# Patient Record
Sex: Female | Born: 1985 | Race: White | Hispanic: No | Marital: Single | Smoking: Never smoker
Health system: Southern US, Community
[De-identification: ages and names within clinical notes are randomized; demographics above are authoritative.]

## PROBLEM LIST (undated history)

## (undated) DIAGNOSIS — T7840XA Allergy, unspecified, initial encounter: Secondary | ICD-10-CM

## (undated) HISTORY — DX: Allergy, unspecified, initial encounter: T78.40XA

## (undated) HISTORY — PX: NO PAST SURGERIES: SHX2092

---

## 2014-02-05 ENCOUNTER — Emergency Department: Payer: Self-pay | Admitting: Emergency Medicine

## 2014-06-24 IMAGING — CR DG FOOT COMPLETE 3+V*L*
1 series · 3 of 3 positions shown · non-contrast
Comparison: None.

CLINICAL DATA: Post trip and fall, now with pain and swelling
involving the third through fifth metatarsals

EXAM:
LEFT FOOT - COMPLETE 3+ VIEW

[Series 1: x foot ap left · 0.14mm/px · 3 of 3 slices shown]
[im 1/3]
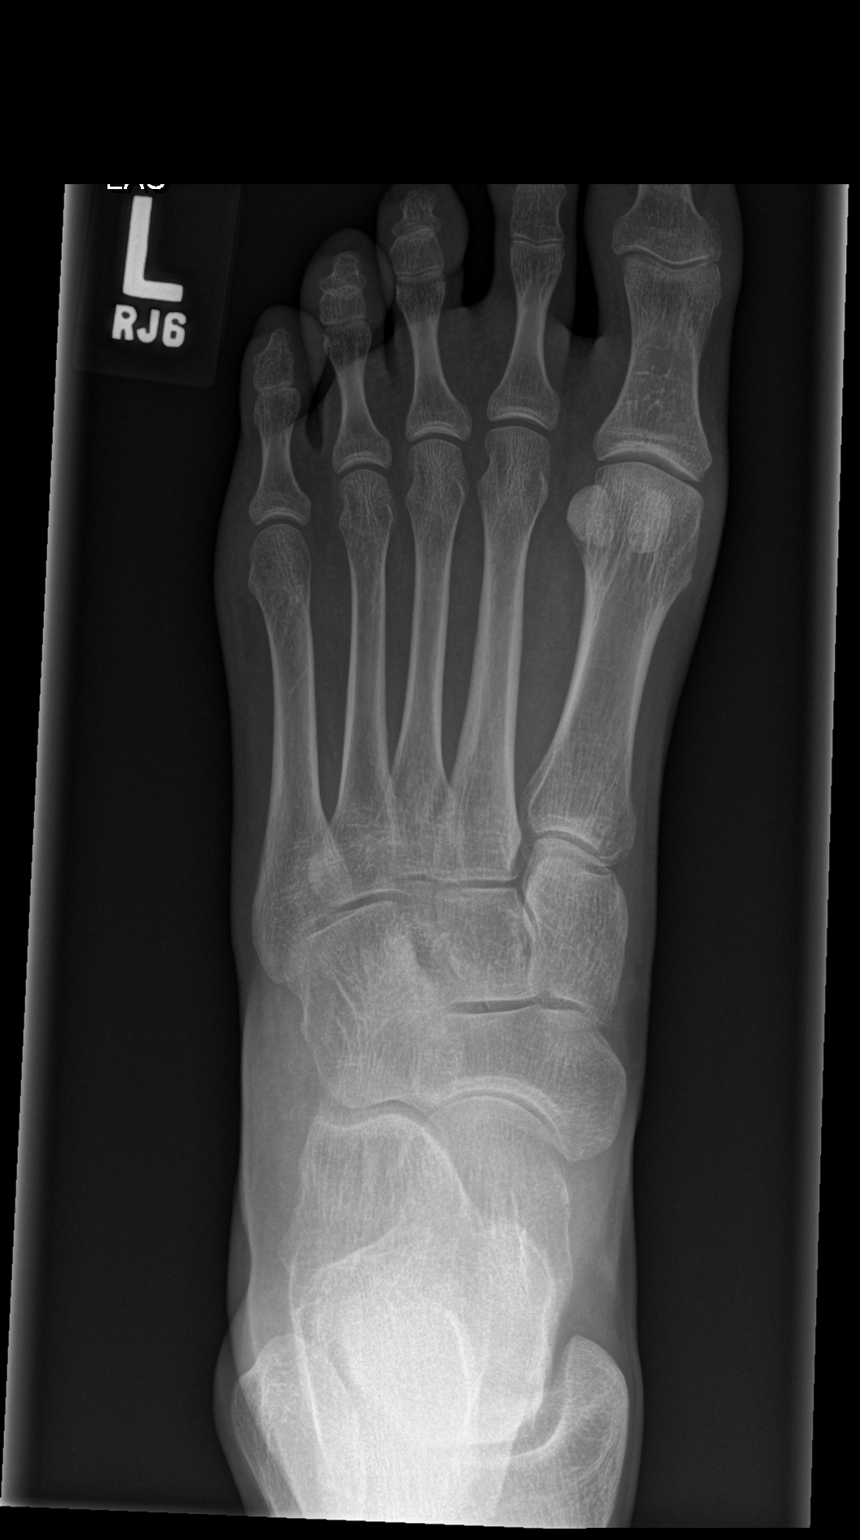
[im 2/3]
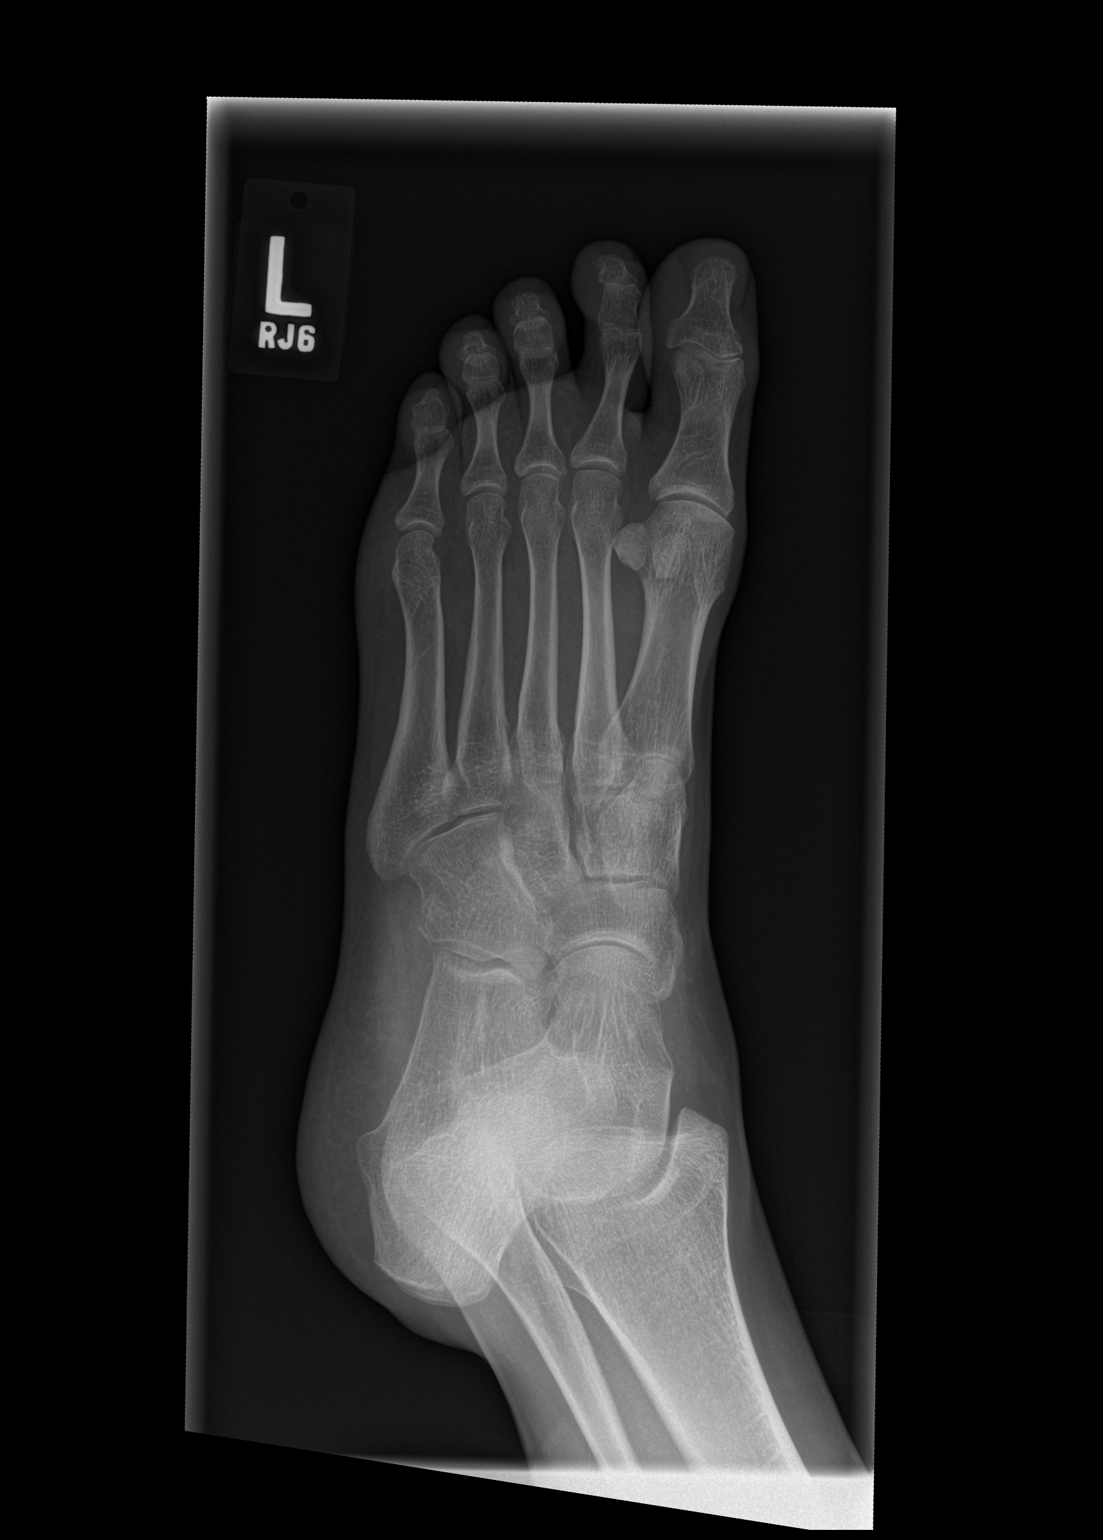
[im 3/3]
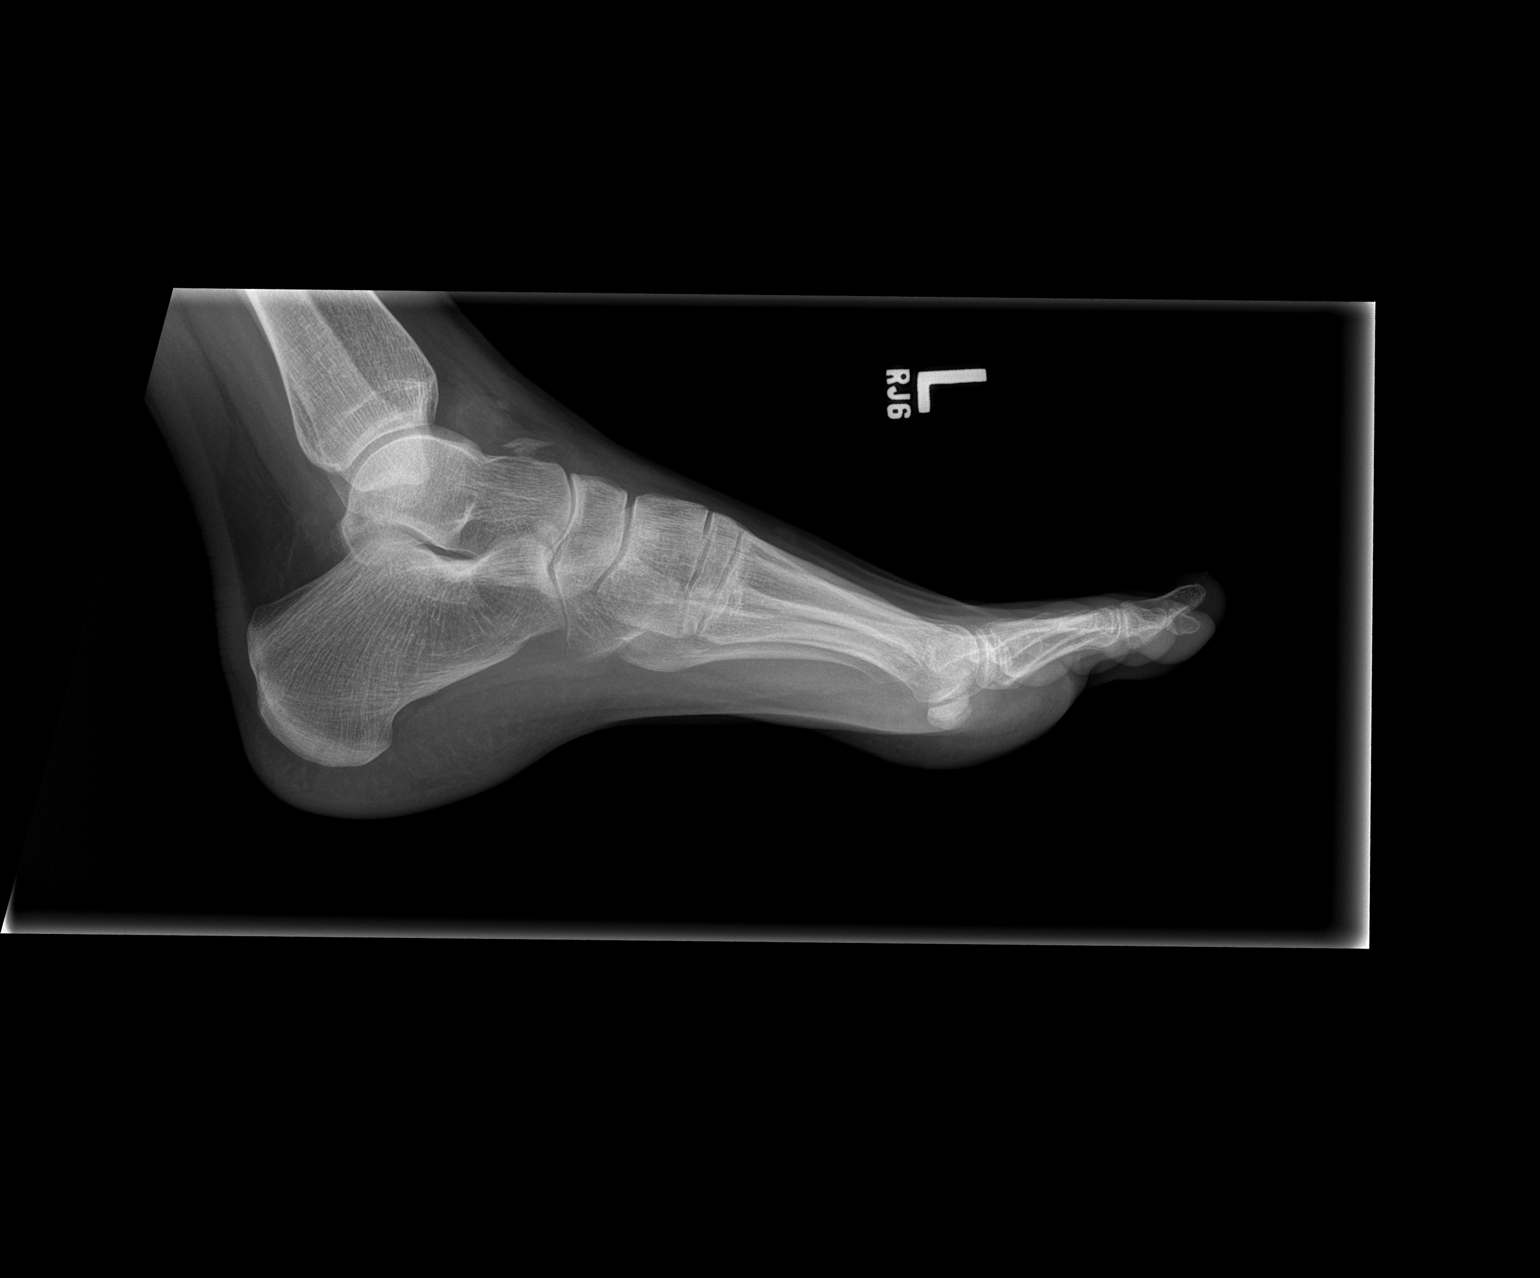

[3 of 3 positions shown; findings below may reference images not displayed]

FINDINGS: Seen only on the lateral radiograph is an apparent avulsion fracture
involving the presumed cranial aspect of the anterior beak of the
talus. This finding is associated with adjacent soft tissue
swelling.

Otherwise, no displaced fractures. Joint spaces are preserved. The
Lisfranc joint is preserved.

No radiopaque foreign body.
IMPRESSION: Seen only on the provided lateral radiograph is an apparent avulsion
fracture involving the cranial aspect of anterior beak of the talus.

## 2018-09-12 ENCOUNTER — Ambulatory Visit: Payer: Self-pay

## 2019-01-27 ENCOUNTER — Other Ambulatory Visit: Payer: Self-pay

## 2019-01-27 ENCOUNTER — Ambulatory Visit: Payer: BLUE CROSS/BLUE SHIELD | Admitting: Family Medicine

## 2019-01-27 ENCOUNTER — Encounter: Payer: Self-pay | Admitting: Family Medicine

## 2019-01-27 ENCOUNTER — Encounter (INDEPENDENT_AMBULATORY_CARE_PROVIDER_SITE_OTHER): Payer: Self-pay

## 2019-01-27 VITALS — BP 122/70 | HR 94 | Temp 98.1°F | Resp 16 | Ht 69.0 in | Wt 152.2 lb

## 2019-01-27 DIAGNOSIS — R6889 Other general symptoms and signs: Secondary | ICD-10-CM | POA: Diagnosis not present

## 2019-01-27 DIAGNOSIS — E049 Nontoxic goiter, unspecified: Secondary | ICD-10-CM

## 2019-01-27 DIAGNOSIS — Z23 Encounter for immunization: Secondary | ICD-10-CM

## 2019-01-27 DIAGNOSIS — R002 Palpitations: Secondary | ICD-10-CM | POA: Diagnosis not present

## 2019-01-27 DIAGNOSIS — E040 Nontoxic diffuse goiter: Secondary | ICD-10-CM | POA: Insufficient documentation

## 2019-01-27 DIAGNOSIS — R946 Abnormal results of thyroid function studies: Secondary | ICD-10-CM

## 2019-01-27 DIAGNOSIS — Z113 Encounter for screening for infections with a predominantly sexual mode of transmission: Secondary | ICD-10-CM

## 2019-01-27 DIAGNOSIS — Z13220 Encounter for screening for lipoid disorders: Secondary | ICD-10-CM

## 2019-01-27 NOTE — Progress Notes (Signed)
New Patient Office Visit  Subjective:  Patient ID: Gabrielle Ali, female    DOB: 11-30-1986  Age: 33 y.o. MRN: [id number]  CC:  Chief Complaint  Patient presents with  . Establish Care  . Thyroid Problem    per dentist need thyroid  nodule checked  . Headache    HPI Gabrielle Ali presents to establish care and for the following: - She has not been to see a PCP in at least 10 years.  Thyroid Nodule: Found by dentist about a month ago.  No difficulty swallowing, no family history of thyroid disease.  Does have some cold intolerance, endorses dry skin, diarrhea, and occasional palpitations (maybe 1-2 times a month and lasts a few seconds at most). No constipation. No PICA cravings.  Headaches/Migraines: Had history as a Ship broker.  In 2011 she had a her first migraine, a few years later she had another.  She has had 2 migraines in the last 2 years. When these do occur she is sensitive to light, has nausea, 1 episode of vomiting, and last about a day.  No aura is described. No vision changes, no numbness/tingling/weakness in extremities.  She does note she has an issue with syncope when her knees lock and she is very hot, but this has not happened in many years.   History reviewed. No pertinent past medical history.  Past Surgical History:  Procedure Laterality Date  . NO PAST SURGERIES      Family History  Problem Relation Age of Onset  . Glaucoma Maternal Grandmother   . Pneumonia Maternal Grandfather        Passed from pneumonia  . Alzheimer's disease Paternal Grandfather     Social History   Socioeconomic History  . Marital status: Single    Spouse name: Not on file  . Number of children: 0  . Years of education: Not on file  . Highest education level: Bachelor's degree (e.g., BA, AB, BS)  Occupational History    Employer: [employer]  Social Needs  . Financial resource strain: Not hard at all  . Food insecurity:    Worry: Never true   Inability: Never true  . Transportation needs:    Medical: No    Non-medical: No  Tobacco Use  . Smoking status: Never Smoker  . Smokeless tobacco: Never Used  Substance and Sexual Activity  . Alcohol use: Not Currently  . Drug use: Never  . Sexual activity: Not Currently    Partners: Male    Birth control/protection: None  Lifestyle  . Physical activity:    Days per week: 4 days    Minutes per session: 30 min  . Stress: Not at all  Relationships  . Social connections:    Talks on phone: More than three times a week    Gets together: More than three times a week    Attends religious service: Not on file    Active member of club or organization: No    Attends meetings of clubs or organizations: Never    Relationship status: Never married  . Intimate partner violence:    Fear of current or ex partner: No    Emotionally abused: No    Physically abused: No    Forced sexual activity: No  Other Topics Concern  . Not on file  Social History Narrative   Lives with her parents and her dog.    ROS Review of Systems  Constitutional: Negative.  Negative for chills, fatigue, fever  and unexpected weight change.  HENT: Negative.  Negative for congestion and sore throat.   Respiratory: Negative for cough and shortness of breath.   Cardiovascular: Negative for chest pain, palpitations and leg swelling.  Gastrointestinal: Negative for abdominal pain, constipation, diarrhea, nausea and vomiting.  Endocrine: Negative for polydipsia, polyphagia and polyuria.  Genitourinary: Negative.   Musculoskeletal: Negative.   Skin: Negative.   Neurological: Negative for light-headedness and headaches.  Hematological: Negative.   Psychiatric/Behavioral: Negative.   All other systems reviewed and are negative.  Objective:   Today's Vitals: BP 122/70 (BP Location: Right Arm, Patient Position: Sitting, Cuff Size: Large)   Pulse 94   Temp 98.1 F (36.7 C) (Oral)   Resp 16   Ht 5\' 9"  (1.753 m)    Wt 152 lb 3.2 oz (69 kg)   SpO2 98%   BMI 22.48 kg/m   Physical Exam Vitals signs and nursing note reviewed.  Constitutional:      General: She is not in acute distress.    Appearance: She is well-developed.  HENT:     Head: Normocephalic and atraumatic.     Right Ear: External ear normal.     Left Ear: External ear normal.     Nose: Nose normal.     Mouth/Throat:     Pharynx: No oropharyngeal exudate.  Eyes:     Conjunctiva/sclera: Conjunctivae normal.     Pupils: Pupils are equal, round, and reactive to light.  Neck:     Musculoskeletal: Normal range of motion and neck supple.     Thyroid: Thyromegaly (Diffuse large goiter that appears symmetric with no palpable nodule. ) present.     Vascular: No JVD.  Cardiovascular:     Rate and Rhythm: Normal rate and regular rhythm.     Heart sounds: Normal heart sounds.  Pulmonary:     Effort: Pulmonary effort is normal.     Breath sounds: Normal breath sounds.  Abdominal:     General: Bowel sounds are normal.     Palpations: Abdomen is soft.  Musculoskeletal: Normal range of motion.        General: No tenderness.  Skin:    General: Skin is warm and dry.     Findings: No rash.  Neurological:     Mental Status: She is alert and oriented to person, place, and time.     Cranial Nerves: No cranial nerve deficit.  Psychiatric:        Behavior: Behavior normal.        Thought Content: Thought content normal.        Judgment: Judgment normal.     Assessment & Plan:   Problem List Items Addressed This Visit      Endocrine   Goiter diffuse - Primary   Relevant Orders   US THYROID   Thyroid Panel With TSH     Other   Palpitations   Relevant Orders   US THYROID   Thyroid Panel With TSH   Lipid panel   COMPLETE METABOLIC PANEL WITH GFR   CBC w/Diff/Platelet   Cold intolerance   Relevant Orders   US THYROID   Thyroid Panel With TSH   COMPLETE METABOLIC PANEL WITH GFR   CBC w/Diff/Platelet    Other Visit Diagnoses     Routine screening for STI (sexually transmitted infection)       Relevant Orders   HIV Antibody (routine testing w rflx)   RPR   Hepatitis C antibody   Lipid screening  Relevant Orders   Lipid panel   Need for Tdap vaccination       Relevant Orders   Tdap vaccine greater than or equal to 7yo IM (Completed)      No outpatient encounter medications on file as of 01/27/2019.   No facility-administered encounter medications on file as of 01/27/2019.     Follow-up: Return in about 1 month (around 02/25/2019) for CPE w/ pap and w/ Follow Up - 40min appt.   Doren CustardEmily E Sathvik Tiedt, FNP

## 2019-01-27 NOTE — Patient Instructions (Addendum)
Headache Tips:  Keep a headache log: Record the date and time your headaches start and end, the severity of your pain (0-10), any possible triggers, what treatments you tried, and if any of those treatments worked.  Common Headache Triggers: Certain food additives, alcohol, artificial sweeteners (like aspartame), caffeine (overuse and when you stop suddenly), delayed or skipped meals, exercise, certain foods (chocolate, soft cheeses, red wine), bright lights, loud noises, menses, certain odors, oral contraceptives, depression and anxiety, sleep issues, smoke inhalation (smoking or second-hand smoke), stress, weather changes  Please work to reduce your stress by taking at least 30 minutes to exercise, stretch, and/or meditate every day.  Drink plenty of water throughout the day, do not skip meals, and eat healthy foods that are not fried, fatty, high in sugar, or processed.  

## 2019-01-28 LAB — LIPID PANEL
CHOL/HDL RATIO: 3 (calc) (ref <5.0)
CHOLESTEROL: 206 mg/dL — AB (ref <200)
HDL: 68 mg/dL (ref >=50)
LDL CHOLESTEROL (CALC): 119 mg/dL — AB
NON-HDL CHOLESTEROL (CALC): 138 mg/dL — AB (ref <130)
Triglycerides: 91 mg/dL (ref <150)

## 2019-01-28 LAB — THYROID PANEL WITH TSH
FREE THYROXINE INDEX: 2.3 (ref 1.4–3.8)
T3 UPTAKE: 29 % (ref 22–35)
T4 TOTAL: 7.9 ug/dL (ref 5.1–11.9)
TSH: 2.98 mIU/L

## 2019-01-28 LAB — HIV ANTIBODY (ROUTINE TESTING W REFLEX): HIV: NONREACTIVE

## 2019-01-28 LAB — CBC WITH DIFFERENTIAL/PLATELET
Absolute Monocytes: 663 cells/uL (ref 200–950)
BASOS ABS: 52 {cells}/uL (ref 0–200)
Basophils Relative: 0.8 %
EOS PCT: 1.2 %
Eosinophils Absolute: 78 cells/uL (ref 15–500)
HEMATOCRIT: 39.5 % (ref 35.0–45.0)
Hemoglobin: 12.9 g/dL (ref 11.7–15.5)
Lymphs Abs: 1762 cells/uL (ref 850–3900)
MCH: 28.9 pg (ref 27.0–33.0)
MCHC: 32.7 g/dL (ref 32.0–36.0)
MCV: 88.6 fL (ref 80.0–100.0)
MONOS PCT: 10.2 %
MPV: 10.3 fL (ref 7.5–12.5)
NEUTROS ABS: 3946 {cells}/uL (ref 1500–7800)
NEUTROS PCT: 60.7 %
PLATELETS: 400 10*3/uL (ref 140–400)
RBC: 4.46 10*6/uL (ref 3.80–5.10)
RDW: 11.9 % (ref 11.0–15.0)
Total Lymphocyte: 27.1 %
WBC: 6.5 10*3/uL (ref 3.8–10.8)

## 2019-01-28 LAB — HEPATITIS C ANTIBODY
Hepatitis C Ab: NONREACTIVE
SIGNAL TO CUT-OFF: 0.05 (ref <1.00)

## 2019-01-28 LAB — RPR: RPR: NONREACTIVE

## 2019-02-03 ENCOUNTER — Ambulatory Visit
Admission: RE | Admit: 2019-02-03 | Discharge: 2019-02-03 | Disposition: A | Discharge status: Home / Self Care | Payer: BLUE CROSS/BLUE SHIELD | Source: Ambulatory Visit | Attending: Family Medicine | Admitting: Family Medicine

## 2019-02-03 DIAGNOSIS — R002 Palpitations: Secondary | ICD-10-CM

## 2019-02-03 DIAGNOSIS — R6889 Other general symptoms and signs: Secondary | ICD-10-CM | POA: Diagnosis present

## 2019-02-03 DIAGNOSIS — E049 Nontoxic goiter, unspecified: Secondary | ICD-10-CM | POA: Insufficient documentation

## 2019-02-03 DIAGNOSIS — E040 Nontoxic diffuse goiter: Secondary | ICD-10-CM

## 2019-02-25 ENCOUNTER — Other Ambulatory Visit (HOSPITAL_COMMUNITY)
Admission: RE | Admit: 2019-02-25 | Discharge: 2019-02-25 | Disposition: A | Discharge status: Home / Self Care | Payer: BLUE CROSS/BLUE SHIELD | Source: Ambulatory Visit | Attending: Family Medicine | Admitting: Family Medicine

## 2019-02-25 ENCOUNTER — Encounter: Payer: Self-pay | Admitting: Family Medicine

## 2019-02-25 ENCOUNTER — Other Ambulatory Visit: Payer: Self-pay

## 2019-02-25 ENCOUNTER — Ambulatory Visit: Payer: BLUE CROSS/BLUE SHIELD | Admitting: Family Medicine

## 2019-02-25 VITALS — BP 120/80 | HR 101 | Temp 97.9°F | Resp 16 | Ht 68.5 in | Wt 151.9 lb

## 2019-02-25 DIAGNOSIS — E040 Nontoxic diffuse goiter: Secondary | ICD-10-CM

## 2019-02-25 DIAGNOSIS — Z124 Encounter for screening for malignant neoplasm of cervix: Secondary | ICD-10-CM | POA: Diagnosis not present

## 2019-02-25 DIAGNOSIS — Z01419 Encounter for gynecological examination (general) (routine) without abnormal findings: Secondary | ICD-10-CM | POA: Diagnosis present

## 2019-02-25 DIAGNOSIS — E7800 Pure hypercholesterolemia, unspecified: Secondary | ICD-10-CM

## 2019-02-25 DIAGNOSIS — E049 Nontoxic goiter, unspecified: Secondary | ICD-10-CM | POA: Diagnosis not present

## 2019-02-25 NOTE — Addendum Note (Signed)
Addended by: Doren Custard on: 02/25/2019 11:04 AM   Modules accepted: Orders

## 2019-02-25 NOTE — Progress Notes (Addendum)
Name: Gabrielle Ali   MRN: [id number]    DOB: 26-Dec-1985   Date:02/25/2019       Progress Note  Subjective  Chief Complaint  Chief Complaint  Patient presents with  . Annual Exam    HPI  Patient presents for annual CPE and follow up:  Goiter: Labs were WNL, US showed enlargement without nodule or focal abnormality, likely a sequelae from prior thyroiditis. Denies any recent palpitations, cold intolerance, difficulty swallowing or sob.  Will monitor Thyroid levels annually.  Elevated LDL: Discussed HLD diet, regular exercise, will monitor annually at this point.  Diet: heavy carb breakfast, skip lunch. Fruits/ vegetables/meats for dinner Exercise: walks dog 4-5x/week 1 mile   USPSTF grade A and B recommendations    Office Visit from 02/25/2019 in [facility]  AUDIT-C Score  0     Depression:  Depression screen [facility] 2/9 02/25/2019 01/27/2019  Decreased Interest 0 0  Down, Depressed, Hopeless 0 0  PHQ - 2 Score 0 0  Altered sleeping 0 0  Tired, decreased energy 0 0  Change in appetite 0 0  Feeling bad or failure about yourself  0 0  Trouble concentrating 0 0  Moving slowly or fidgety/restless 0 0  Suicidal thoughts 0 0  PHQ-9 Score 0 0  Difficult doing work/chores - Not difficult at all   Hypertension: BP Readings from Last 3 Encounters:  02/25/19 120/80  01/27/19 122/70   Obesity: Wt Readings from Last 3 Encounters:  02/25/19 151 lb 14.4 oz (68.9 kg)  01/27/19 152 lb 3.2 oz (69 kg)   BMI Readings from Last 3 Encounters:  02/25/19 22.76 kg/m  01/27/19 22.48 kg/m    Hep C Screening: Negative STD testing and prevention (HIV/chl/gon/syphilis): HIV and syphilis negative at last check, we will do gonorrhea/chlamydia and pap today. Intimate partner violence: No concerns Sexual History/Pain during Intercourse: No concerns Menstrual History/LMP/Abnormal Bleeding: No concers Incontinence Symptoms: No concerns  Advanced Care Planning: A  voluntary discussion about advance care planning including the explanation and discussion of advance directives.  Discussed health care proxy and Living will, and the patient was able to identify a health care proxy as Mother - Giavonni Cizek.  Patient does not have a living will at present time. If patient does have living will, I have requested they bring this to the clinic to be scanned in to their chart.  Breast cancer: No family history.  Breast exam today No results found for: HMMAMMO  BRCA gene screening: N/A Cervical cancer screening: Will check today  Osteoporosis Screening: Mom may have osteoporosis, PGM with osteoporosis.  No results found for: HMDEXASCAN  Lipids: Will check annually Lab Results  Component Value Date   CHOL 206 (H) 01/27/2019   Lab Results  Component Value Date   HDL 68 01/27/2019   Lab Results  Component Value Date   LDLCALC 119 (H) 01/27/2019   Lab Results  Component Value Date   TRIG 91 01/27/2019   Lab Results  Component Value Date   CHOLHDL 3.0 01/27/2019   No results found for: LDLDIRECT  Glucose: Normal.  Skin cancer: No concerning lesions; had skin survey from Dermatology. Colorectal cancer: Denies family or personal history of colorectal cancer, no changes in BM's - no blood in stool, dark and tarry stool, mucus in stool, or constipation/diarrhea. Lung cancer: Never smoker; Low Dose CT Chest recommended if Age 56-80 years, 30 pack-year currently smoking OR have quit w/in 15years. Patient does not qualify.  ECG:No chest pain, shortness of breath, palpitations  Patient Active Problem List   Diagnosis Date Noted  . 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 Palpitations 01/27/2019  . Cold intolerance 01/27/2019    Past Surgical History:  Procedure Laterality Date  . NO PAST SURGERIES      Family History  Problem Relation Age of Onset  . Glaucoma Maternal Grandmother   . Pneumonia Maternal Grandfather        Passed from pneumonia  .  Alzheimer's disease Paternal Grandfather   . Parkinson's disease Paternal Grandfather     Social History   Socioeconomic History  . Marital status: Single    Spouse name: Not on file  . Number of children: 0  . Years of education: Not on file  . Highest education level: Bachelor's degree (e.g., BA, AB, BS)  Occupational History    Employer: [employer]  Social Needs  . Financial resource strain: Not hard at all  . Food insecurity:    Worry: Never true    Inability: Never true  . Transportation needs:    Medical: No    Non-medical: No  Tobacco Use  . Smoking status: Never Smoker  . Smokeless tobacco: Never Used  Substance and Sexual Activity  . Alcohol use: Not Currently  . Drug use: Never  . Sexual activity: Not Currently    Partners: Male    Birth control/protection: None  Lifestyle  . Physical activity:    Days per week: 4 days    Minutes per session: 30 min  . Stress: Not at all  Relationships  . Social connections:    Talks on phone: More than three times a week    Gets together: More than three times a week    Attends religious service: Not on file    Active member of club or organization: No    Attends meetings of clubs or organizations: Never    Relationship status: Never married  . Intimate partner violence:    Fear of current or ex partner: No    Emotionally abused: No    Physically abused: No    Forced sexual activity: No  Other Topics Concern  . Not on file  Social History Narrative   Lives with her parents and her dog.    No current outpatient medications on file.  No Known Allergies   ROS  Constitutional: Negative for fever or weight change.  Respiratory: Negative for cough and shortness of breath.   Cardiovascular: Negative for chest pain or palpitations.  Gastrointestinal: Negative for abdominal pain, no bowel changes.  Musculoskeletal: Negative for gait problem or joint swelling.  Skin: Negative for rash.  Neurological: Negative  for dizziness or headache.  No other specific complaints in a complete review of systems (except as listed in HPI above).  Objective  Vitals:   02/25/19 1020  BP: 120/80  Pulse: (!) 101  Resp: 16  Temp: 97.9 F (36.6 C)  TempSrc: Oral  SpO2: 98%  Weight: 151 lb 14.4 oz (68.9 kg)  Height: 5' 8.5" (1.74 m)    Body mass index is 22.76 kg/m. Auscultated rate is approx 90bpm  Physical Exam  Constitutional: Patient appears well-developed and well-nourished. No distress.  HENT: Head: Normocephalic and atraumatic. Ears: B TMs ok, no erythema or effusion; Nose: Nose normal. Mouth/Throat: Oropharynx is clear and moist. No oropharyngeal exudate.  Eyes: Conjunctivae and EOM are normal. Pupils are equal, round, and reactive to light. No scleral icterus.  Neck: Normal range of motion.  Neck supple. No JVD present. Diffuse goiter present. Cardiovascular: Normal rate, regular rhythm and normal heart sounds.  No murmur heard. No BLE edema. Pulmonary/Chest: Effort normal and breath sounds normal. No respiratory distress. Abdominal: Soft. Bowel sounds are normal, no distension. There is no tenderness. no masses Breast: no lumps or masses, no nipple discharge or rashes FEMALE GENITALIA:  External genitalia normal External urethra normal Vaginal vault normal without discharge or lesions Cervix normal without discharge or lesions Bimanual exam normal without masses RECTAL: no rectal masses or hemorrhoids Musculoskeletal: Normal range of motion, no joint effusions. No gross deformities Neurological: he is alert and oriented to person, place, and time. No cranial nerve deficit. Coordination, balance, strength, speech and gait are normal.  Skin: Skin is warm and dry. No rash noted. No erythema.  Psychiatric: Patient has a normal mood and affect. behavior is normal. Judgment and thought content normal.  Recent Results (from the past 2160 hour(s))  Thyroid Panel With TSH     Status: None    Collection Time: 01/27/19 11:48 AM  Result Value Ref Range   T3 Uptake 29 22 - 35 %   T4, Total 7.9 5.1 - 11.9 mcg/dL   Free Thyroxine Index 2.3 1.4 - 3.8   TSH 2.98 mIU/L    Comment:           Reference Range .           > or = 20 Years  0.40-4.50 .                Pregnancy Ranges           First trimester    0.26-2.66           Second trimester   0.55-2.73           Third trimester    0.43-2.91   HIV Antibody (routine testing w rflx)     Status: None   Collection Time: 01/27/19 11:48 AM  Result Value Ref Range   HIV 1&2 Ab, 4th Generation NON-REACTIVE NON-REACTI    Comment: HIV-1 antigen and HIV-1/HIV-2 antibodies were not detected. There is no laboratory evidence of HIV infection. Marland Kitchen PLEASE NOTE: This information has been disclosed to you from records whose confidentiality may be protected by state law.  If your state requires such protection, then the state law prohibits you from making any further disclosure of the information without the specific written consent of the person to whom it pertains, or as otherwise permitted by law. A general authorization for the release of medical or other information is NOT sufficient for this purpose. . For additional information please refer to http://education.[facility].com/faq/FAQ106 (This link is being provided for informational/ educational purposes only.) . Marland Kitchen The performance of this assay has not been clinically validated in patients less than 62 years old. .   RPR     Status: None   Collection Time: 01/27/19 11:48 AM  Result Value Ref Range   RPR Ser Ql NON-REACTIVE NON-REACTI  Hepatitis C antibody     Status: None   Collection Time: 01/27/19 11:48 AM  Result Value Ref Range   Hepatitis C Ab NON-REACTIVE NON-REACTI   SIGNAL TO CUT-OFF 0.05 <1.00    Comment: . HCV antibody was non-reactive. There is no laboratory  evidence of HCV infection. . In most cases, no further action is required. However, if recent  HCV exposure is suspected, a test for HCV RNA (test code [phone]) is suggested. . For additional information please  refer to http://education.[facility].com/faq/FAQ22v1 (This link is being provided for informational/ educational purposes only.) .   Lipid panel     Status: Abnormal   Collection Time: 01/27/19 11:48 AM  Result Value Ref Range   Cholesterol 206 (H) <200 mg/dL   HDL 68 > OR = 50 mg/dL   Triglycerides 91 <150 mg/dL   LDL Cholesterol (Calc) 119 (H) mg/dL (calc)    Comment: Reference range: <100 . Desirable range <100 mg/dL for primary prevention;   <70 mg/dL for patients with CHD or diabetic patients  with > or = 2 CHD risk factors. Marland Kitchen LDL-C is now calculated using the Martin-Hopkins  calculation, which is a validated novel method providing  better accuracy than the Friedewald equation in the  estimation of LDL-C.  Cresenciano Genre et al. Annamaria Helling. 7989;211(94): 2061-2068  (http://education.[facility].com/faq/FAQ164)    Total CHOL/HDL Ratio 3.0 <5.0 (calc)   Non-HDL Cholesterol (Calc) 138 (H) <130 mg/dL (calc)    Comment: For patients with diabetes plus 1 major ASCVD risk  factor, treating to a non-HDL-C goal of <100 mg/dL  (LDL-C of <70 mg/dL) is considered a therapeutic  option.   CBC w/Diff/Platelet     Status: None   Collection Time: 01/27/19 11:48 AM  Result Value Ref Range   WBC 6.5 3.8 - 10.8 Thousand/uL   RBC 4.46 3.80 - 5.10 Million/uL   Hemoglobin 12.9 11.7 - 15.5 g/dL   HCT 39.5 35.0 - 45.0 %   MCV 88.6 80.0 - 100.0 fL   MCH 28.9 27.0 - 33.0 pg   MCHC 32.7 32.0 - 36.0 g/dL   RDW 11.9 11.0 - 15.0 %   Platelets 400 140 - 400 Thousand/uL   MPV 10.3 7.5 - 12.5 fL   Neutro Abs 3,946 1,500 - 7,800 cells/uL   Lymphs Abs 1,762 850 - 3,900 cells/uL   Absolute Monocytes 663 200 - 950 cells/uL   Eosinophils Absolute 78 15 - 500 cells/uL   Basophils Absolute 52 0 - 200 cells/uL   Neutrophils Relative % 60.7 %   Total Lymphocyte 27.1 %   Monocytes  Relative 10.2 %   Eosinophils Relative 1.2 %   Basophils Relative 0.8 %   PHQ2/9: Depression screen [facility] 2/9 02/25/2019 01/27/2019  Decreased Interest 0 0  Down, Depressed, Hopeless 0 0  PHQ - 2 Score 0 0  Altered sleeping 0 0  Tired, decreased energy 0 0  Change in appetite 0 0  Feeling bad or failure about yourself  0 0  Trouble concentrating 0 0  Moving slowly or fidgety/restless 0 0  Suicidal thoughts 0 0  PHQ-9 Score 0 0  Difficult doing work/chores - Not difficult at all   Fall Risk: Fall Risk  02/25/2019 01/27/2019  Falls in the past year? 0 0  Number falls in past yr: 0 0  Injury with Fall? 0 -  Follow up - Falls evaluation completed    Functional Status Survey: Is the patient deaf or have difficulty hearing?: No Does the patient have difficulty seeing, even when wearing glasses/contacts?: No Does the patient have difficulty concentrating, remembering, or making decisions?: No Does the patient have difficulty walking or climbing stairs?: No Does the patient have difficulty dressing or bathing?: No Does the patient have difficulty doing errands alone such as visiting a doctor's office or shopping?: No   Assessment & Plan  1. Well woman exam -USPSTF grade A and B recommendations reviewed with patient; age-appropriate recommendations, preventive care, screening tests, etc discussed and encouraged; healthy  living encouraged; see AVS for patient education given to patient -Discussed importance of 150 minutes of physical activity weekly, eat two servings of fish weekly, eat one serving of tree nuts ( cashews, pistachios, pecans, almonds.Marland Kitchen) every other day, eat 6 servings of fruit/vegetables daily and drink plenty of water and avoid sweet beverages.  - Pap per orders  2. Goiter diffuse - Stable, [place] normal, we will check labs annually.  3. Elevated LDL cholesterol level - Diet discussed, will check labs annually.

## 2019-02-27 LAB — CYTOLOGY - PAP
CHLAMYDIA, DNA PROBE: NEGATIVE
DIAGNOSIS: NEGATIVE
Neisseria Gonorrhea: NEGATIVE

## 2019-08-27 ENCOUNTER — Ambulatory Visit: Payer: Self-pay

## 2019-08-27 ENCOUNTER — Other Ambulatory Visit: Payer: Self-pay

## 2019-08-27 DIAGNOSIS — Z23 Encounter for immunization: Secondary | ICD-10-CM

## 2020-02-22 ENCOUNTER — Ambulatory Visit: Payer: Self-pay | Attending: Internal Medicine

## 2020-02-22 DIAGNOSIS — Z23 Encounter for immunization: Secondary | ICD-10-CM | POA: Insufficient documentation

## 2020-02-22 NOTE — Progress Notes (Signed)
   Covid-19 Vaccination Clinic  Name:  Gabrielle Ali    MRN: [id number] DOB: 07/24/1986  02/22/2020  Gabrielle Ali was observed post Covid-19 immunization for 15 minutes without incident. She was provided with Vaccine Information Sheet and instruction to access the V-Safe system.   Gabrielle Ali was instructed to call 911 with any severe reactions post vaccine: Marland Kitchen Difficulty breathing  . Swelling of face and throat  . A fast heartbeat  . A bad rash all over body  . Dizziness and weakness   Immunizations Administered    Name Date Dose VIS Date Route   Pfizer COVID-19 Vaccine 02/22/2020 11:35 AM 0.3 mL 11/28/2019 Intramuscular   Manufacturer: [employer], [employer]   Lot: [id number]   NDC: [address]-5

## 2020-02-26 ENCOUNTER — Encounter: Payer: BLUE CROSS/BLUE SHIELD | Admitting: Family Medicine

## 2020-03-01 ENCOUNTER — Encounter: Payer: BLUE CROSS/BLUE SHIELD | Admitting: Family Medicine

## 2020-03-17 ENCOUNTER — Ambulatory Visit: Payer: Self-pay | Attending: Internal Medicine

## 2020-03-17 DIAGNOSIS — Z23 Encounter for immunization: Secondary | ICD-10-CM

## 2020-03-17 NOTE — Progress Notes (Signed)
   Covid-19 Vaccination Clinic  Name:  Gabrielle Ali    MRN: [id number] DOB: 29-Apr-1986  03/17/2020  [name] was observed post Covid-19 immunization for 15 minutes without incident. She was provided with Vaccine Information Sheet and instruction to access the V-Safe system.   [name] was instructed to call 911 with any severe reactions post vaccine: Marland Kitchen Difficulty breathing  . Swelling of face and throat  . A fast heartbeat  . A bad rash all over body  . Dizziness and weakness   Immunizations Administered    Name Date Dose VIS Date Route   Pfizer COVID-19 Vaccine 03/17/2020  3:46 PM 0.3 mL 11/28/2019 Intramuscular   Manufacturer: [employer], [employer]   Lot: [id number]   NDC: [address]-8

## 2020-04-05 IMAGING — US US THYROID
1 series · 14 of 25 positions shown · non-contrast
Comparison: None.

CLINICAL DATA: Thyroid nodule on exam

EXAM:
THYROID ULTRASOUND
TECHNIQUE: Ultrasound examination of the thyroid gland and adjacent soft
tissues was performed.

[Series 1: us thyroid · 0.07mm/px · 14 of 43 slices shown]
[im 1/43]
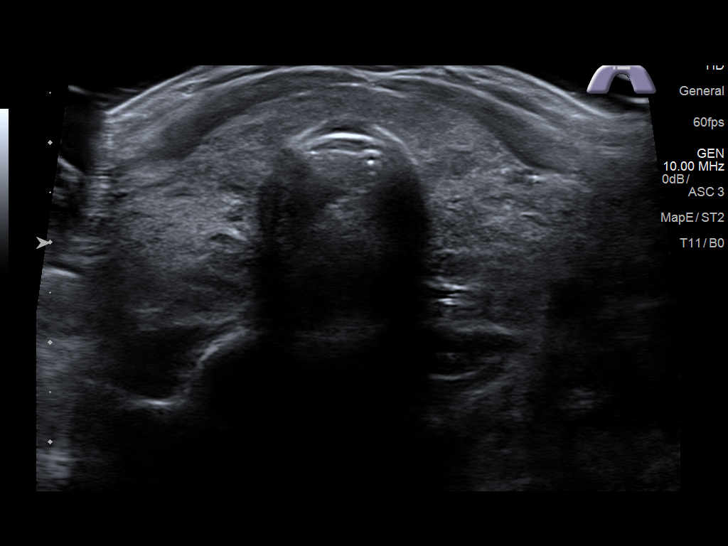
[im 4/43]
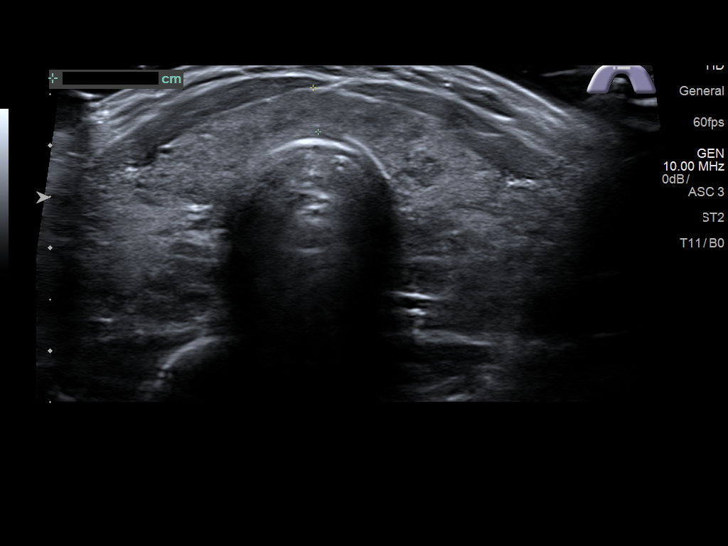
[im 8/43]
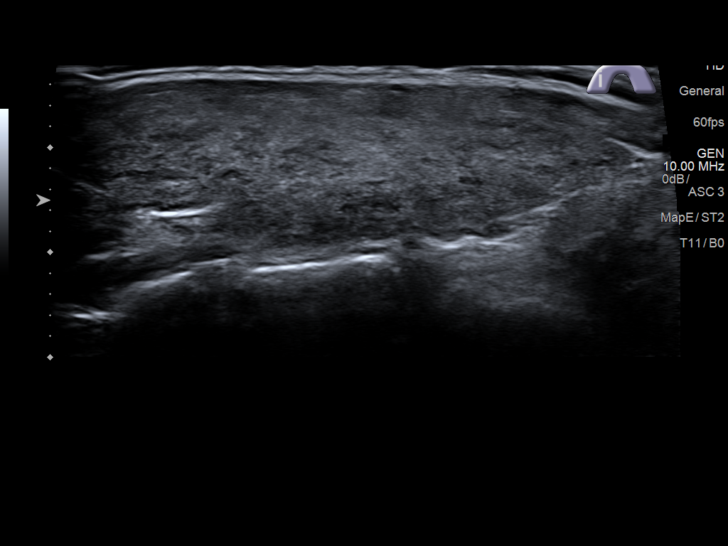
[im 11/43]
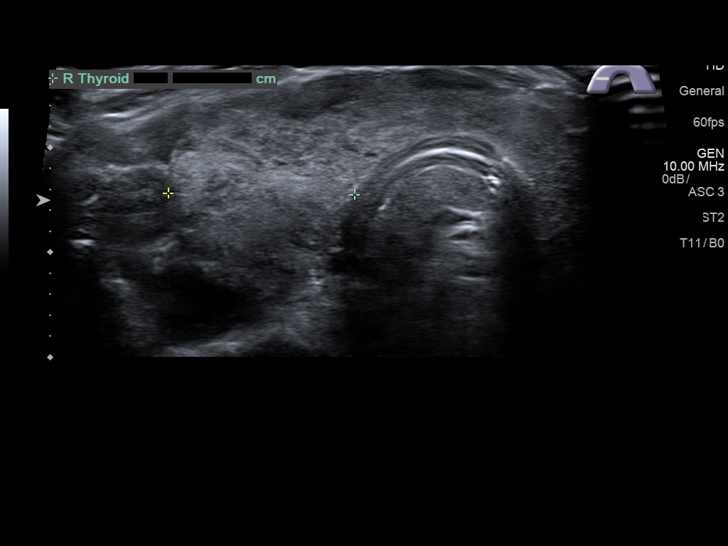
[im 15/43]
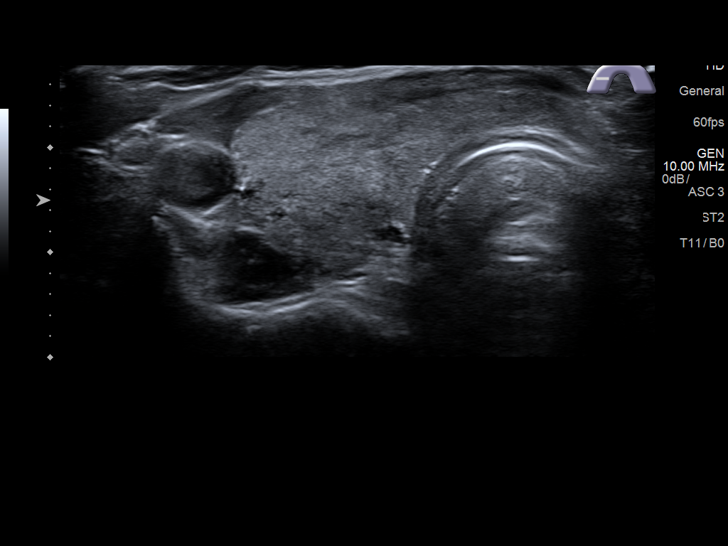
[im 16/43]
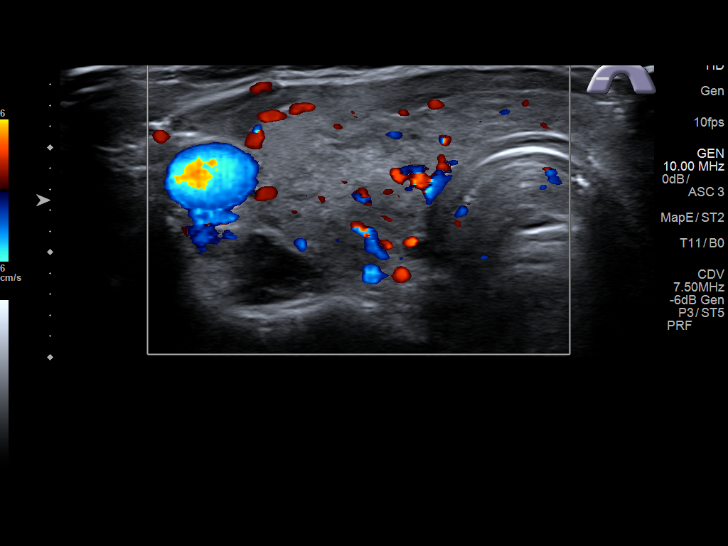
[im 20/43]
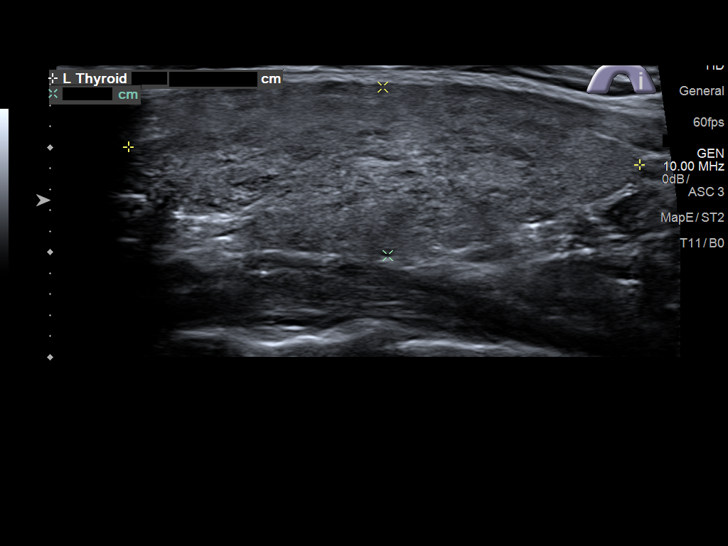
[im 23/43]
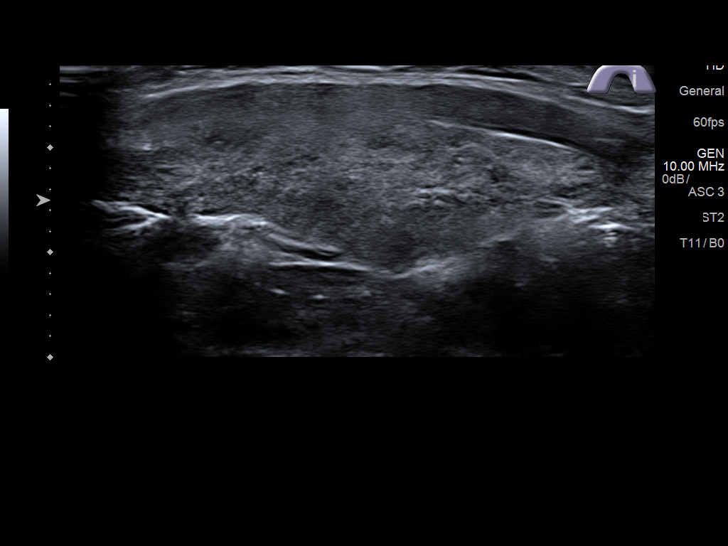
[im 27/43]
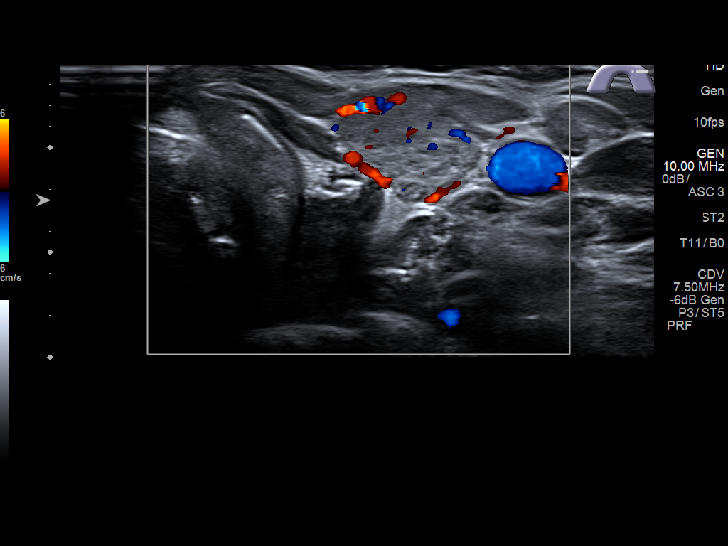
[im 29/43]
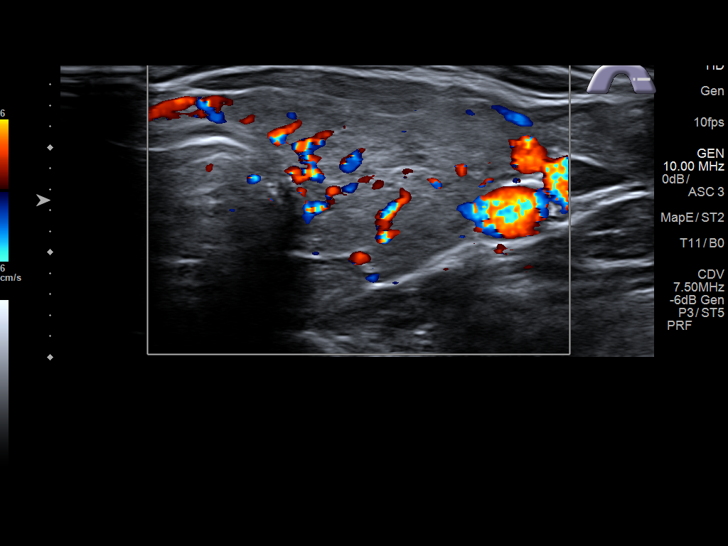
[im 32/43]
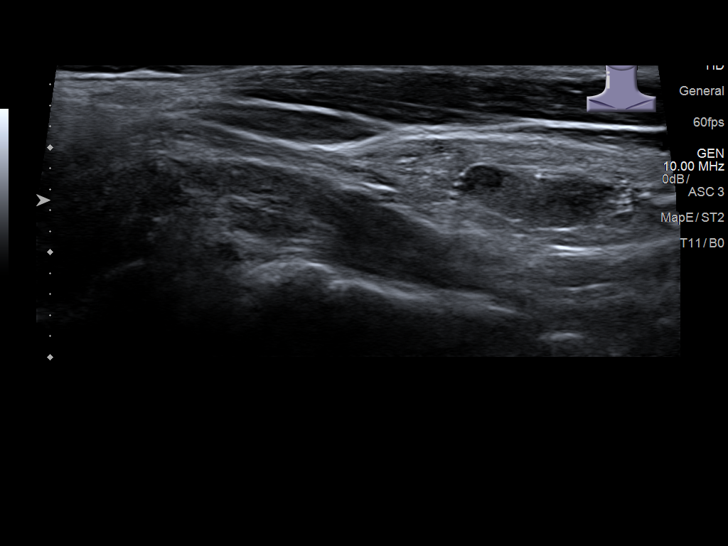
[im 36/43]
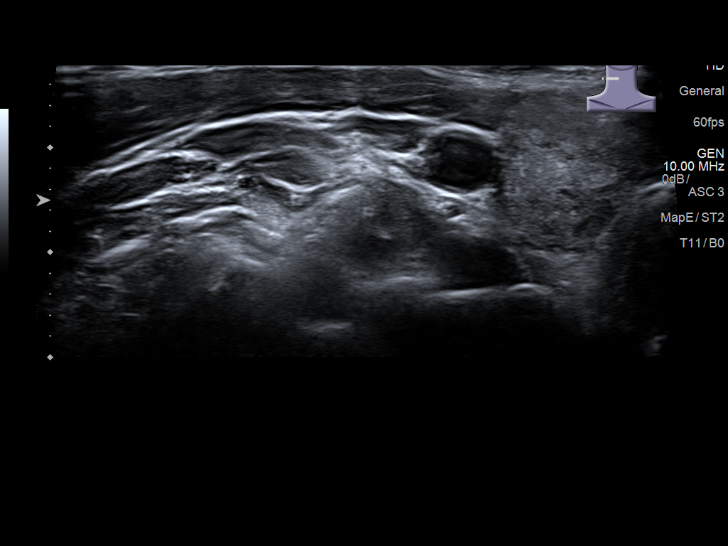
[im 39/43]
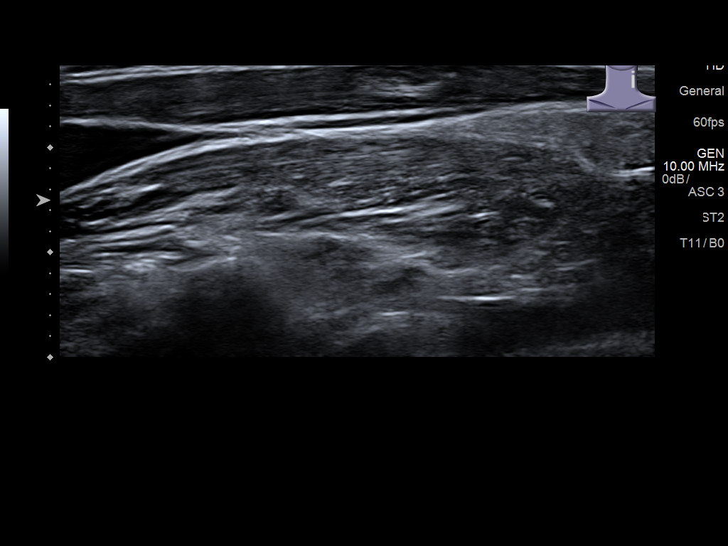
[im 43/43]
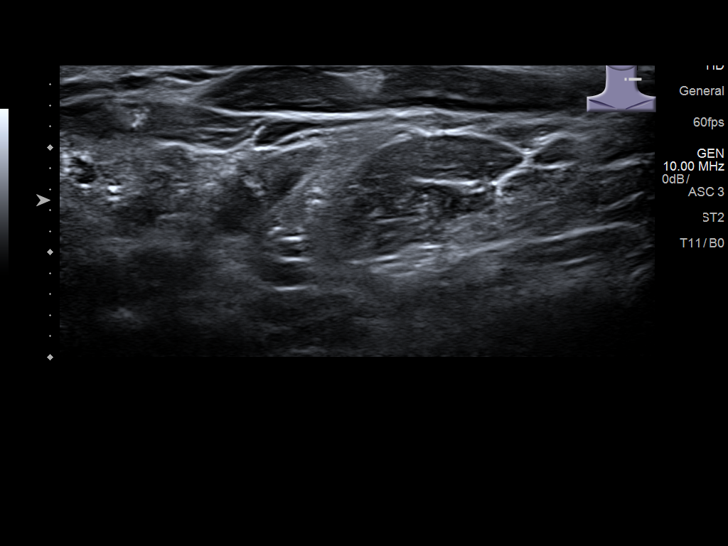

[14 of 25 positions shown; findings below may reference images not displayed]

FINDINGS: Parenchymal Echotexture: Moderately heterogenous

Isthmus: 4 mm

Right lobe: 4.9 x 1.4 x 1.8 cm

Left lobe: 4.9 x 1.6 x 2.3 cm

_________________________________________________________

Estimated total number of nodules >/= 1 cm: 0

Number of spongiform nodules >/=  2 cm not described below (TR1): 0

Number of mixed cystic and solid nodules >/= 1.5 cm not described
below (TR2): 0

_________________________________________________________

Moderate gland heterogeneity without discrete nodule or focal
abnormality. Normal vascularity. No regional adenopathy.
IMPRESSION: Nonspecific moderate gland heterogeneity may represent sequelae from
prior thyroiditis. No significant nodule or discrete abnormality.

No adenopathy

The above is in keeping with the ACR TI-RADS recommendations - [HOSPITAL] 9828;[DATE].

## 2020-05-18 ENCOUNTER — Encounter: Payer: BLUE CROSS/BLUE SHIELD | Admitting: Family Medicine

## 2020-06-01 ENCOUNTER — Encounter: Payer: Self-pay | Admitting: Family Medicine

## 2020-06-01 ENCOUNTER — Ambulatory Visit (INDEPENDENT_AMBULATORY_CARE_PROVIDER_SITE_OTHER): Payer: BC Managed Care – PPO | Admitting: Family Medicine

## 2020-06-01 ENCOUNTER — Other Ambulatory Visit: Payer: Self-pay

## 2020-06-01 VITALS — BP 112/72 | HR 92 | Temp 97.9°F | Resp 16 | Ht 69.0 in | Wt 158.1 lb

## 2020-06-01 DIAGNOSIS — E049 Nontoxic goiter, unspecified: Secondary | ICD-10-CM

## 2020-06-01 DIAGNOSIS — E040 Nontoxic diffuse goiter: Secondary | ICD-10-CM

## 2020-06-01 DIAGNOSIS — Z0000 Encounter for general adult medical examination without abnormal findings: Secondary | ICD-10-CM

## 2020-06-01 LAB — T4, FREE: Free T4: 1 ng/dL (ref 0.8–1.8)

## 2020-06-01 LAB — TSH: TSH: 2.78 mIU/L

## 2020-06-01 NOTE — Patient Instructions (Signed)
Preventive Care 21-35 Years Old, Female Preventive care refers to visits with your health care provider and lifestyle choices that can promote health and wellness. This includes:  A yearly physical exam. This may also be called an annual well check.  Regular dental visits and eye exams.  Immunizations.  Screening for certain conditions.  Healthy lifestyle choices, such as eating a healthy diet, getting regular exercise, not using drugs or products that contain nicotine and tobacco, and limiting alcohol use. What can I expect for my preventive care visit? Physical exam Your health care provider will check your:  Height and weight. This may be used to calculate body mass index (BMI), which tells if you are at a healthy weight.  Heart rate and blood pressure.  Skin for abnormal spots. Counseling Your health care provider may ask you questions about your:  Alcohol, tobacco, and drug use.  Emotional well-being.  Home and relationship well-being.  Sexual activity.  Eating habits.  Work and work environment.  Method of birth control.  Menstrual cycle.  Pregnancy history. What immunizations do I need?  Influenza (flu) vaccine  This is recommended every year. Tetanus, diphtheria, and pertussis (Tdap) vaccine  You may need a Td booster every 10 years. Varicella (chickenpox) vaccine  You may need this if you have not been vaccinated. Human papillomavirus (HPV) vaccine  If recommended by your health care provider, you may need three doses over 6 months. Measles, mumps, and rubella (MMR) vaccine  You may need at least one dose of MMR. You may also need a second dose. Meningococcal conjugate (MenACWY) vaccine  One dose is recommended if you are age 19-21 years and a first-year college student living in a residence hall, or if you have one of several medical conditions. You may also need additional booster doses. Pneumococcal conjugate (PCV13) vaccine  You may need  this if you have certain conditions and were not previously vaccinated. Pneumococcal polysaccharide (PPSV23) vaccine  You may need one or two doses if you smoke cigarettes or if you have certain conditions. Hepatitis A vaccine  You may need this if you have certain conditions or if you travel or work in places where you may be exposed to hepatitis A. Hepatitis B vaccine  You may need this if you have certain conditions or if you travel or work in places where you may be exposed to hepatitis B. Haemophilus influenzae type b (Hib) vaccine  You may need this if you have certain conditions. You may receive vaccines as individual doses or as more than one vaccine together in one shot (combination vaccines). Talk with your health care provider about the risks and benefits of combination vaccines. What tests do I need?  Blood tests  Lipid and cholesterol levels. These may be checked every 5 years starting at age 20.  Hepatitis C test.  Hepatitis B test. Screening  Diabetes screening. This is done by checking your blood sugar (glucose) after you have not eaten for a while (fasting).  Sexually transmitted disease (STD) testing.  BRCA-related cancer screening. This may be done if you have a family history of breast, ovarian, tubal, or peritoneal cancers.  Pelvic exam and Pap test. This may be done every 3 years starting at age 21. Starting at age 30, this may be done every 5 years if you have a Pap test in combination with an HPV test. Talk with your health care provider about your test results, treatment options, and if necessary, the need for more tests.   Follow these instructions at home: Eating and drinking   Eat a diet that includes fresh fruits and vegetables, whole grains, lean protein, and low-fat dairy.  Take vitamin and mineral supplements as recommended by your health care provider.  Do not drink alcohol if: ? Your health care provider tells you not to drink. ? You are  pregnant, may be pregnant, or are planning to become pregnant.  If you drink alcohol: ? Limit how much you have to 0-1 drink a day. ? Be aware of how much alcohol is in your drink. In the [place] one drink equals one 12 oz bottle of beer (355 mL), one 5 oz glass of wine (148 mL), or one 1 oz glass of hard liquor (44 mL). Lifestyle  Take daily care of your teeth and gums.  Stay active. Exercise for at least 30 minutes on 5 or more days each week.  Do not use any products that contain nicotine or tobacco, such as cigarettes, e-cigarettes, and chewing tobacco. If you need help quitting, ask your health care provider.  If you are sexually active, practice safe sex. Use a condom or other form of birth control (contraception) in order to prevent pregnancy and STIs (sexually transmitted infections). If you plan to become pregnant, see your health care provider for a preconception visit. What's next?  Visit your health care provider once a year for a well check visit.  Ask your health care provider how often you should have your eyes and teeth checked.  Stay up to date on all vaccines. This information is not intended to replace advice given to you by your health care provider. Make sure you discuss any questions you have with your health care provider. Document Revised: 08/15/2018 Document Reviewed: 08/15/2018 Elsevier Patient Education  2020 [employer].

## 2020-06-01 NOTE — Progress Notes (Signed)
Patient: Gabrielle Ali, Female    DOB: [birth year], 34 y.o.   MRN: [id number] Gabrielle Grana, PA-C Visit Date: 06/01/2020  Today's Provider: Delsa Grana, PA-C   Chief Complaint  Patient presents with  . Annual Exam    no pap   Subjective:   Annual physical exam:  Gabrielle Ali is a 34 y.o. female who presents today for complete physical exam:  Exercise/Activity:  Working out with friends through zoom Diet/nutrition:   Too many carbs, potato chips, adding more fruits and veggies Sleep:  Sleeping great 7-8 hours  Thyroid is same size maybe decreasing - prior US and labs, no sx currently Current Symptoms: she denies fatigue, weight gain, feeling cold and cold intolerance, constipation, swelling, feeling slow, losing hair, anxiousness, feeling excessive energy, tremulousness, palpitations, sweating and weight loss   USPSTF grade A and B recommendations - reviewed and addressed today  Depression:  Phq 9 completed today by patient, was reviewed by me with patient in the room PHQ score is neg, pt feels  PHQ 2/9 Scores 06/01/2020 02/25/2019 01/27/2019  PHQ - 2 Score 0 0 0  PHQ- 9 Score 0 0 0   Depression screen [facility] 2/9 06/01/2020 02/25/2019 01/27/2019  Decreased Interest 0 0 0  Down, Depressed, Hopeless 0 0 0  PHQ - 2 Score 0 0 0  Altered sleeping 0 0 0  Tired, decreased energy 0 0 0  Change in appetite 0 0 0  Feeling bad or failure about yourself  0 0 0  Trouble concentrating 0 0 0  Moving slowly or fidgety/restless 0 0 0  Suicidal thoughts 0 0 0  PHQ-9 Score 0 0 0  Difficult doing work/chores Not difficult at all - Not difficult at all    Alcohol screening:   Office Visit from 06/01/2020 in [facility]  AUDIT-C Score 0      Immunizations and Health Maintenance: Health Maintenance  Topic Date Due  . INFLUENZA VACCINE  07/18/2020  . PAP SMEAR-Modifier  02/24/2022  . TETANUS/TDAP  01/27/2029  . COVID-19 Vaccine  Completed  . Hepatitis C  Screening  Completed  . HIV Screening  Completed     Hep C Screening: done previously  STD testing and prevention (HIV/chl/gon/syphilis):  see above, no additional testing desired by pt today Done previously and neg  Intimate partner violence:    Sexual History/Pain during Intercourse: Single  Menstrual History/LMP/Abnormal Bleeding:   Normal, regular cycles, last about 4 d with spotting on 5th day Patient's last menstrual period was 05/11/2020.  Incontinence Symptoms:   none  Breast cancer: not indicated yet for age Last Mammogram: *See HM list above BRCA gene screening: none  Cervical cancer screening:   Pt denies family hx of cancers - breast, ovarian, uterine, colon:     Osteoporosis:    Skin cancer:  Sees dermatology, goes skin survery  Hx of skin CA -  NO Discussed atypical lesions   Colorectal cancer:   Colonoscopy is not due per age Discussed concerning signs and sx of CRC, pt denies   Lung cancer:    n/a Low Dose CT Chest recommended if Age 48-80 years, 30 pack-year currently smoking OR have quit w/in 15years. Patient does not qualify.    Social History   Tobacco Use  . Smoking status: Never Smoker  . Smokeless tobacco: Never Used  Vaping Use  . Vaping Use: Never used  Substance Use Topics  . Alcohol use: Not Currently  . Drug  use: Never       [facility] Visit from 06/01/2020 in [facility]  AUDIT-C Score 0      Family History  Problem Relation Age of Onset  . Glaucoma Maternal Grandmother   . Pneumonia Maternal Grandfather        Passed from pneumonia  . Parkinson's disease Paternal Grandfather      Blood pressure/Hypertension: BP Readings from Last 3 Encounters:  06/01/20 112/72  02/25/19 120/80  01/27/19 122/70    Weight/Obesity: Wt Readings from Last 3 Encounters:  06/01/20 158 lb 1.6 oz (71.7 kg)  02/25/19 151 lb 14.4 oz (68.9 kg)  01/27/19 152 lb 3.2 oz (69 kg)   BMI Readings from Last 3 Encounters:  06/01/20  23.35 kg/m  02/25/19 22.76 kg/m  01/27/19 22.48 kg/m     Lipids:  Lab Results  Component Value Date   CHOL 206 (H) 01/27/2019   Lab Results  Component Value Date   HDL 68 01/27/2019   Lab Results  Component Value Date   LDLCALC 119 (H) 01/27/2019   Lab Results  Component Value Date   TRIG 91 01/27/2019   Lab Results  Component Value Date   CHOLHDL 3.0 01/27/2019   No results found for: LDLDIRECT Based on the results of lipid panel his/her cardiovascular risk factor ( using [place] )  in the next 10 years is: The ASCVD Risk score Mikey Bussing DC Jr., et al., 2013) failed to calculate for the following reasons:   The 2013 ASCVD risk score is only valid for ages 30 to 18 Glucose:  No results found for: GLUCOSE, GLUCAP Hypertension: BP Readings from Last 3 Encounters:  06/01/20 112/72  02/25/19 120/80  01/27/19 122/70   Obesity: Wt Readings from Last 3 Encounters:  06/01/20 158 lb 1.6 oz (71.7 kg)  02/25/19 151 lb 14.4 oz (68.9 kg)  01/27/19 152 lb 3.2 oz (69 kg)   BMI Readings from Last 3 Encounters:  06/01/20 23.35 kg/m  02/25/19 22.76 kg/m  01/27/19 22.48 kg/m     Advanced Care Planning:  A voluntary discussion about advance care planning including the explanation and discussion of advance directives.   Discussed health care proxy and Living will, and the patient was able to identify a health care proxy as mom and dad, paul Reffitt and [place] Patient does not have a living will at present time.   Social History      She        Social History   Socioeconomic History  . Marital status: Single    Spouse name: Not on file  . Number of children: 0  . Years of education: Not on file  . Highest education level: Bachelor's degree (e.g., BA, AB, BS)  Occupational History    Employer: [employer]  Tobacco Use  . Smoking status: Never Smoker  . Smokeless tobacco: Never Used  Vaping Use  . Vaping Use: Never used  Substance and Sexual  Activity  . Alcohol use: Not Currently  . Drug use: Never  . Sexual activity: Not Currently    Partners: Male    Birth control/protection: None  Other Topics Concern  . Not on file  Social History Narrative   Lives with her parents and her dog.   Social Determinants of Health   Financial Resource Strain:   . Difficulty of Paying Living Expenses:   Food Insecurity:   . Worried About Charity fundraiser in the Last Year:   .  Ran Out of Food in the Last Year:   Transportation Needs:   . Film/video editor (Medical):   Marland Kitchen Lack of Transportation (Non-Medical):   Physical Activity:   . Days of Exercise per Week:   . Minutes of Exercise per Session:   Stress:   . Feeling of Stress :   Social Connections:   . Frequency of Communication with Friends and Family:   . Frequency of Social Gatherings with Friends and Family:   . Attends Religious Services:   . Active Member of Clubs or Organizations:   . Attends Archivist Meetings:   Marland Kitchen Marital Status:     Family History        Family History  Problem Relation Age of Onset  . Glaucoma Maternal Grandmother   . Pneumonia Maternal Grandfather        Passed from pneumonia  . Parkinson's disease Paternal Grandfather     Patient Active Problem List   Diagnosis Date Noted  . 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 Palpitations 01/27/2019  . Cold intolerance 01/27/2019    Past Surgical History:  Procedure Laterality Date  . NO PAST SURGERIES       Current Outpatient Medications:  .  cetirizine (ZYRTEC) 10 MG tablet, Take 10 mg by mouth daily., Disp: , Rfl:   No Known Allergies  Patient Care Team: Gabrielle Grana, PA-C as PCP - General (Family Medicine)  Review of Systems  Constitutional: Negative.  Negative for activity change, appetite change, fatigue and unexpected weight change.  HENT: Negative.   Eyes: Negative.   Respiratory: Negative.  Negative for shortness of breath.   Cardiovascular: Negative.  Negative for  chest pain, palpitations and leg swelling.  Gastrointestinal: Negative.  Negative for abdominal pain and blood in stool.  Endocrine: Negative.   Genitourinary: Negative.   Musculoskeletal: Negative.  Negative for arthralgias, gait problem, joint swelling and myalgias.  Skin: Negative.  Negative for color change, pallor and rash.  Allergic/Immunologic: Negative.   Neurological: Negative.  Negative for syncope and weakness.  Hematological: Negative.   Psychiatric/Behavioral: Negative.  Negative for confusion, dysphoric mood, self-injury and suicidal ideas. The patient is not nervous/anxious.      I personally reviewed active problem list, medication list, allergies, family history, social history, health maintenance, notes from last encounter, lab results, imaging with the patient/caregiver today.        Objective:   Vitals:  Vitals:   06/01/20 1311  BP: 112/72  Pulse: 92  Resp: 16  Temp: 97.9 F (36.6 C)  TempSrc: Temporal  SpO2: 98%  Weight: 158 lb 1.6 oz (71.7 kg)  Height: '5\' 9"'  (1.753 m)    Body mass index is 23.35 kg/m.  Physical Exam Vitals and nursing note reviewed.  Constitutional:      General: She is not in acute distress.    Appearance: Normal appearance. She is well-developed. She is not ill-appearing, toxic-appearing or diaphoretic.  HENT:     Head: Normocephalic and atraumatic.     Right Ear: External ear normal.     Left Ear: External ear normal.     Nose: Nose normal.     Mouth/Throat:     Pharynx: Uvula midline.  Eyes:     General: Lids are normal.     Conjunctiva/sclera: Conjunctivae normal.     Pupils: Pupils are equal, round, and reactive to light.  Neck:     Thyroid: Thyromegaly present. No thyroid tenderness.     Trachea: Trachea and phonation normal.  No tracheal deviation.  Cardiovascular:     Rate and Rhythm: Normal rate and regular rhythm.     Pulses: Normal pulses.          Radial pulses are 2+ on the right side and 2+ on the left side.        Posterior tibial pulses are 2+ on the right side and 2+ on the left side.     Heart sounds: Normal heart sounds. No murmur heard.  No friction rub. No gallop.   Pulmonary:     Effort: Pulmonary effort is normal. No respiratory distress.     Breath sounds: Normal breath sounds. No stridor. No wheezing, rhonchi or rales.  Chest:     Chest wall: No tenderness.  Abdominal:     General: Bowel sounds are normal. There is no distension.     Palpations: Abdomen is soft.     Tenderness: There is no abdominal tenderness. There is no guarding or rebound.  Musculoskeletal:        General: No deformity. Normal range of motion.     Cervical back: Normal range of motion and neck supple.  Lymphadenopathy:     Cervical: No cervical adenopathy.  Skin:    General: Skin is warm and dry.     Capillary Refill: Capillary refill takes less than 2 seconds.     Coloration: Skin is not pale.     Findings: No rash.  Neurological:     Mental Status: She is alert and oriented to person, place, and time.     Motor: No abnormal muscle tone.     Gait: Gait normal.  Psychiatric:        Speech: Speech normal.        Behavior: Behavior normal.       Fall Risk: Fall Risk  06/01/2020 02/25/2019 01/27/2019  Falls in the past year? 0 0 0  Number falls in past yr: 0 0 0  Injury with Fall? 0 0 -  Follow up Falls evaluation completed - Falls evaluation completed    Functional Status Survey: Is the patient deaf or have difficulty hearing?: No Does the patient have difficulty seeing, even when wearing glasses/contacts?: No Does the patient have difficulty concentrating, remembering, or making decisions?: No Does the patient have difficulty walking or climbing stairs?: No Does the patient have difficulty dressing or bathing?: No Does the patient have difficulty doing errands alone such as visiting a doctor's office or shopping?: No   Assessment & Plan:    CPE completed today  . USPSTF grade A and B  recommendations reviewed with patient; age-appropriate recommendations, preventive care, screening tests, etc discussed and encouraged; healthy living encouraged; see AVS for patient education given to patient  . Discussed importance of 150 minutes of physical activity weekly, AHA exercise recommendations given to pt in AVS/handout  . Discussed importance of healthy diet:  eating lean meats and proteins, avoiding trans fats and saturated fats, avoid simple sugars and excessive carbs in diet, eat 6 servings of fruit/vegetables daily and drink plenty of water and avoid sweet beverages.    . Recommended pt to do annual eye exam and routine dental exams/cleanings  . Depression, alcohol, fall screening completed as documented above and per flowsheets  . Reviewed Health Maintenance: Health Maintenance  Topic Date Due  . INFLUENZA VACCINE  07/18/2020  . PAP SMEAR-Modifier  02/24/2022  . TETANUS/TDAP  01/27/2029  . COVID-19 Vaccine  Completed  . Hepatitis C Screening  Completed  .  HIV Screening  Completed    . Immunizations: Immunization History  Administered Date(s) Administered  . Influenza,inj,Quad PF,6+ Mos 08/27/2019  . Influenza-Unspecified 08/28/2017, 09/12/2018  . MMR 10/12/2018  . Meningococcal Conjugate 07/08/2004  . PFIZER SARS-COV-2 Vaccination 02/22/2020, 03/17/2020  . Tdap 05/16/2002, 01/27/2019      1. Adult general medical exam Completed as per above  2. Goiter diffuse Pt feels it is slightly decreased in size, no current or active sx of hyper or hypothyroid, recheck labs - will likely continue with annual monitoring - TSH - T4, free   Gabrielle Grana, PA-C 06/01/20 1:32 PM  [facility]

## 2021-06-03 ENCOUNTER — Other Ambulatory Visit: Payer: Self-pay

## 2021-06-03 ENCOUNTER — Encounter: Payer: Self-pay | Admitting: Family Medicine

## 2021-06-03 ENCOUNTER — Ambulatory Visit (INDEPENDENT_AMBULATORY_CARE_PROVIDER_SITE_OTHER): Payer: BC Managed Care – PPO | Admitting: Family Medicine

## 2021-06-03 VITALS — BP 112/64 | HR 97 | Temp 98.0°F | Resp 16 | Ht 69.0 in | Wt 156.6 lb

## 2021-06-03 DIAGNOSIS — E040 Nontoxic diffuse goiter: Secondary | ICD-10-CM

## 2021-06-03 DIAGNOSIS — E785 Hyperlipidemia, unspecified: Secondary | ICD-10-CM | POA: Diagnosis not present

## 2021-06-03 DIAGNOSIS — E049 Nontoxic goiter, unspecified: Secondary | ICD-10-CM | POA: Diagnosis not present

## 2021-06-03 DIAGNOSIS — Z0000 Encounter for general adult medical examination without abnormal findings: Secondary | ICD-10-CM | POA: Diagnosis not present

## 2021-06-03 DIAGNOSIS — J309 Allergic rhinitis, unspecified: Secondary | ICD-10-CM | POA: Insufficient documentation

## 2021-06-03 DIAGNOSIS — H6981 Other specified disorders of Eustachian tube, right ear: Secondary | ICD-10-CM

## 2021-06-03 MED ORDER — FLUTICASONE PROPIONATE 50 MCG/ACT NA SUSP: 2.0000 | Freq: Every day | NASAL | 6 refills | Status: DC

## 2021-06-03 NOTE — Progress Notes (Signed)
Patient: Gabrielle Ali, Female    DOB: 12-20-1985, 35 y.o.   MRN: [id number] Delsa Grana, PA-C Visit Date: 06/03/2021  Today's Provider: Delsa Grana, PA-C   Chief Complaint  Patient presents with   Annual Exam   Subjective:   Annual physical exam:  Gabrielle Ali is a 35 y.o. female who presents today for complete physical exam:  Exercise/Activity: 3 d week 40 min each Diet/nutrition: healthy, wants to improve health Sleep: no concerns  Pt wished to do routine f/up on chronic conditions today in addition to CPE. Advised pt of separate visit billing/coding  Goiter hx of palpitations/cold intolerance, last thyroid labs were normal Lab Results  Component Value Date   TSH 2.78 06/01/2020   Hyperlipidemia: Not on meds Last Lipids: Lab Results  Component Value Date   CHOL 206 (H) 01/27/2019   HDL 68 01/27/2019   LDLCALC 119 (H) 01/27/2019   TRIG 91 01/27/2019   CHOLHDL 3.0 01/27/2019   - Denies: Chest pain, shortness of breath, myalgias, claudication   USPSTF grade A and B recommendations - reviewed and addressed today  Depression:  Phq 9 completed today by patient, was reviewed by me with patient in the room PHQ score is neg, pt feels good PHQ 2/9 Scores 06/03/2021 06/01/2020 02/25/2019 01/27/2019  PHQ - 2 Score 0 0 0 0  PHQ- 9 Score 0 0 0 0   Depression screen [facility] 2/9 06/03/2021 06/01/2020 02/25/2019 01/27/2019  Decreased Interest 0 0 0 0  Down, Depressed, Hopeless 0 0 0 0  PHQ - 2 Score 0 0 0 0  Altered sleeping 0 0 0 0  Tired, decreased energy 0 0 0 0  Change in appetite 0 0 0 0  Feeling bad or failure about yourself  0 0 0 0  Trouble concentrating 0 0 0 0  Moving slowly or fidgety/restless 0 0 0 0  Suicidal thoughts 0 0 0 0  PHQ-9 Score 0 0 0 0  Difficult doing work/chores Not difficult at all Not difficult at all - Not difficult at all    Alcohol screening: [place] Office Visit from 06/01/2020 in [facility]  AUDIT-C  Score 0       Immunizations and Health Maintenance: Health Maintenance  Topic Date Due   INFLUENZA VACCINE  07/18/2021   PAP SMEAR-Modifier  02/24/2022   TETANUS/TDAP  01/27/2029   COVID-19 Vaccine  Completed   Hepatitis C Screening  Completed   HIV Screening  Completed   Pneumococcal Vaccine 36-36 Years old  Aged Out   HPV VACCINES  Aged Out     Hep C Screening: done  STD testing and prevention (HIV/chl/gon/syphilis):  see above, no additional testing desired by pt today  Intimate partner violence:denies  Sexual History/Pain during Intercourse: Single  Menstrual History/LMP/Abnormal Bleeding: no concerns Patient's last menstrual period was 05/25/2021.  Incontinence Symptoms: none/denies  Breast cancer:  Last Mammogram: *see HM list above BRCA gene screening: none known  Cervical cancer screening: UTD, due nextyear Pt no family hx of cancers - breast, ovarian, uterine, colon:     Osteoporosis:   Discussion on osteoporosis per age, including high calcium and vitamin D supplementation, weight bearing exercises N/a per age  Skin cancer:  Hx of skin CA -  NO Discussed atypical lesions   Colorectal cancer:   Colonoscopy is not due per age - no concerns of family hx   Discussed concerning signs and sx of CRC, pt denies change in bowels,  blood in stool  Lung cancer:   Low Dose CT Chest recommended if Age 60-80 years, 30 pack-year currently smoking OR have quit w/in 15years. Patient does not qualify.    Social History   Tobacco Use   Smoking status: Never   Smokeless tobacco: Never  Vaping Use   Vaping Use: Never used  Substance Use Topics   Alcohol use: Not Currently   Drug use: Never     [place] Office Visit from 06/01/2020 in [facility]  AUDIT-C Score 0       Family History  Problem Relation Age of Onset   Osteoporosis Mother    Kidney Stones Mother    Glaucoma Maternal Grandmother    Pneumonia Maternal Grandfather         Passed from pneumonia   Parkinson's disease Paternal Grandfather      Blood pressure/Hypertension: BP Readings from Last 3 Encounters:  06/03/21 112/64  06/01/20 112/72  02/25/19 120/80    Weight/Obesity: Wt Readings from Last 3 Encounters:  06/03/21 156 lb 9.6 oz (71 kg)  06/01/20 158 lb 1.6 oz (71.7 kg)  02/25/19 151 lb 14.4 oz (68.9 kg)   BMI Readings from Last 3 Encounters:  06/03/21 23.13 kg/m  06/01/20 23.35 kg/m  02/25/19 22.76 kg/m     Lipids:  Lab Results  Component Value Date   CHOL 206 (H) 01/27/2019   Lab Results  Component Value Date   HDL 68 01/27/2019   Lab Results  Component Value Date   LDLCALC 119 (H) 01/27/2019   Lab Results  Component Value Date   TRIG 91 01/27/2019   Lab Results  Component Value Date   CHOLHDL 3.0 01/27/2019   No results found for: LDLDIRECT Based on the results of lipid panel his/her cardiovascular risk factor ( using [place] )  in the next 10 years is: The ASCVD Risk score Mikey Bussing DC Jr., et al., 2013) failed to calculate for the following reasons:   The 2013 ASCVD risk score is only valid for ages 34 to 32   Social History      She        Social History   Socioeconomic History   Marital status: Single    Spouse name: Not on file   Number of children: 0   Years of education: Not on file   Highest education level: Bachelor's degree (e.g., BA, AB, BS)  Occupational History    Employer: [employer]  Tobacco Use   Smoking status: Never   Smokeless tobacco: Never  Vaping Use   Vaping Use: Never used  Substance and Sexual Activity   Alcohol use: Not Currently   Drug use: Never   Sexual activity: Not Currently    Partners: Male    Birth control/protection: None  Other Topics Concern   Not on file  Social History Narrative   Lives with her parents and her dog.   Social Determinants of Health   Financial Resource Strain: Low Risk    Difficulty of Paying Living Expenses: Not hard at all  Food  Insecurity: No Food Insecurity   Worried About Charity fundraiser in the Last Year: Never true   [place] in the Last Year: Never true  Transportation Needs: No Transportation Needs   Lack of Transportation (Medical): No   Lack of Transportation (Non-Medical): No  Physical Activity: Insufficiently Active   Days of Exercise per Week: 3 days   Minutes of Exercise per Session:  40 min  Stress: No Stress Concern Present   Feeling of Stress : Only a little  Social Connections: Moderately Isolated   Frequency of Communication with Friends and Family: More than three times a week   Frequency of Social Gatherings with Friends and Family: More than three times a week   Attends Religious Services: Never   Marine scientist or Organizations: Yes   Attends Music therapist: More than 4 times per year   Marital Status: Never married    Family History        Family History  Problem Relation Age of Onset   Osteoporosis Mother    Kidney Stones Mother    Glaucoma Maternal Grandmother    Pneumonia Maternal Grandfather        Passed from pneumonia   Parkinson's disease Paternal Grandfather     Patient Active Problem List   Diagnosis Date Noted   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   Palpitations 01/27/2019   Cold intolerance 01/27/2019    Past Surgical History:  Procedure Laterality Date   NO PAST SURGERIES       Current Outpatient Medications:    cetirizine (ZYRTEC) 10 MG tablet, Take 10 mg by mouth daily., Disp: , Rfl:   No Known Allergies  Patient Care Team: Delsa Grana, PA-C as PCP - General (Family Medicine)  Review of Systems  Constitutional: Negative.  Negative for activity change, appetite change, fatigue and unexpected weight change.  HENT: Negative.    Eyes: Negative.   Respiratory: Negative.  Negative for shortness of breath.   Cardiovascular: Negative.  Negative for chest pain, palpitations and leg swelling.  Gastrointestinal: Negative.  Negative  for abdominal pain and blood in stool.  Endocrine: Negative.   Genitourinary: Negative.   Musculoskeletal: Negative.  Negative for arthralgias, gait problem, joint swelling and myalgias.  Skin: Negative.  Negative for pallor and rash.  Allergic/Immunologic: Negative.   Neurological: Negative.  Negative for syncope and weakness.  Hematological: Negative.   Psychiatric/Behavioral: Negative.  Negative for dysphoric mood, self-injury and suicidal ideas. The patient is not nervous/anxious.   All other systems reviewed and are negative.   I personally reviewed active problem list, medication list, allergies, family history, social history, health maintenance, notes from last encounter, lab results, imaging with the patient/caregiver today.       Objective:   Vitals:  Vitals:   06/03/21 1006  BP: 112/64  Pulse: 97  Resp: 16  Temp: 98 F (36.7 C)  SpO2: 97%  Weight: 156 lb 9.6 oz (71 kg)  Height: _0  (1.753 m)    Body mass index is 23.13 kg/m.  Physical Exam Vitals and nursing note reviewed.  Constitutional:      General: She is not in acute distress.    Appearance: Normal appearance. She is well-developed, well-groomed and normal weight. She is not ill-appearing, toxic-appearing or diaphoretic.     Interventions: Face mask in place.  HENT:     Head: Normocephalic and atraumatic.     Right Ear: Tympanic membrane, ear canal and external ear normal.     Left Ear: Tympanic membrane, ear canal and external ear normal.     Nose: Congestion present.     Right Turbinates: Swollen.     Left Turbinates: Swollen.     Mouth/Throat:     Lips: Pink.     Mouth: Mucous membranes are moist.     Pharynx: Oropharynx is clear. Uvula midline. Posterior oropharyngeal erythema (mild OP) present. No pharyngeal  swelling, oropharyngeal exudate or uvula swelling.     Tonsils: No tonsillar exudate. 0 on the right. 0 on the left.  Eyes:     General: Lids are normal. No scleral icterus.       Right  eye: No discharge.        Left eye: No discharge.     Conjunctiva/sclera: Conjunctivae normal.     Pupils: Pupils are equal, round, and reactive to light.  Neck:     Thyroid: Thyromegaly present. No thyroid mass or thyroid tenderness.     Trachea: Phonation normal. No tracheal deviation.  Cardiovascular:     Rate and Rhythm: Normal rate and regular rhythm.     Pulses: Normal pulses.          Radial pulses are 2+ on the right side and 2+ on the left side.       Posterior tibial pulses are 2+ on the right side and 2+ on the left side.     Heart sounds: Normal heart sounds. No murmur heard.   No friction rub. No gallop.  Pulmonary:     Effort: Pulmonary effort is normal. No respiratory distress.     Breath sounds: Normal breath sounds. No stridor. No wheezing, rhonchi or rales.  Chest:     Chest wall: No tenderness.  Abdominal:     General: Bowel sounds are normal. There is no distension.     Palpations: Abdomen is soft.     Tenderness: There is no abdominal tenderness. There is no right CVA tenderness or left CVA tenderness.     Hernia: No hernia is present.  Musculoskeletal:     Cervical back: Normal range of motion and neck supple. No rigidity.     Right lower leg: No edema.     Left lower leg: No edema.  Lymphadenopathy:     Cervical: No cervical adenopathy.  Skin:    General: Skin is warm and dry.     Capillary Refill: Capillary refill takes less than 2 seconds.     Coloration: Skin is not jaundiced or pale.     Findings: No rash.  Neurological:     Mental Status: She is alert. Mental status is at baseline.     Motor: No abnormal muscle tone.     Gait: Gait normal.  Psychiatric:        Attention and Perception: Attention and perception normal.        Mood and Affect: Mood and affect normal.        Speech: Speech normal.        Behavior: Behavior normal. Behavior is cooperative.        Thought Content: Thought content normal.        Cognition and Memory: Cognition and  memory normal.        Judgment: Judgment normal.      Fall Risk: Fall Risk  06/03/2021 06/01/2020 02/25/2019 01/27/2019  Falls in the past year? 0 0 0 0  Number falls in past yr: 0 0 0 0  Injury with Fall? 0 0 0 -  Follow up - Falls evaluation completed - Falls evaluation completed    Functional Status Survey: Is the patient deaf or have difficulty hearing?: No Does the patient have difficulty seeing, even when wearing glasses/contacts?: No Does the patient have difficulty concentrating, remembering, or making decisions?: No Does the patient have difficulty walking or climbing stairs?: No Does the patient have difficulty dressing or bathing?: No Does the patient have  difficulty doing errands alone such as visiting a doctor's office or shopping?: No   Assessment & Plan:    CPE completed today  USPSTF grade A and B recommendations reviewed with patient; age-appropriate recommendations, preventive care, screening tests, etc discussed and encouraged; healthy living encouraged; see AVS for patient education given to patient  Discussed importance of 150 minutes of physical activity weekly, AHA exercise recommendations given to pt in AVS/handout  Discussed importance of healthy diet:  eating lean meats and proteins, avoiding trans fats and saturated fats, avoid simple sugars and excessive carbs in diet, eat 6 servings of fruit/vegetables daily and drink plenty of water and avoid sweet beverages.    Recommended pt to do annual eye exam and routine dental exams/cleanings  Depression, alcohol, fall screening completed as documented above and per flowsheets  Reviewed Health Maintenance: Health Maintenance  Topic Date Due   INFLUENZA VACCINE  07/18/2021   PAP SMEAR-Modifier  02/24/2022   TETANUS/TDAP  01/27/2029   COVID-19 Vaccine  Completed   Hepatitis C Screening  Completed   HIV Screening  Completed   Pneumococcal Vaccine 53-50 Years old  Aged Out   HPV VACCINES  Aged Out     Immunizations: Immunization History  Administered Date(s) Administered   Influenza,inj,Quad PF,6+ Mos 08/27/2019   Influenza-Unspecified 08/28/2017, 09/12/2018, 08/25/2020   MMR 10/12/2018   Meningococcal Conjugate 07/08/2004   PFIZER(Purple Top)SARS-COV-2 Vaccination 02/22/2020, 03/17/2020, 11/18/2020   Tdap 05/16/2002, 01/27/2019     ICD-10-CM   1. Adult general medical exam  Z00.00 TSH    Lipid panel    CBC w/Diff/Platelet    COMPLETE METABOLIC PANEL WITH GFR    2. Goiter diffuse  E04.9 TSH    3. Hyperlipidemia, unspecified hyperlipidemia type  E78.5 Lipid panel    COMPLETE METABOLIC PANEL WITH GFR    4. Allergic rhinitis, unspecified seasonality, unspecified trigger  J30.9 fluticasone (FLONASE) 50 MCG/ACT nasal spray   Encouraged her to use a daily antihistamine and add steroid nasal spray    5. Dysfunction of right eustachian tube  H69.81 fluticasone (FLONASE) 50 MCG/ACT nasal spray   TMs bilaterally normal-appearing no effusion, encouraged antihistamine use, steroid nasal spray and decongestants as needed handout on ETD given         Laurell Roof 06/03/21 10:18 AM  [facility]

## 2021-06-03 NOTE — Patient Instructions (Signed)
Preventive Care 21-36 Years Old, Female Preventive care refers to lifestyle choices and visits with your health care provider that can promote health and wellness. This includes: A yearly physical exam. This is also called an annual wellness visit. Regular dental and eye exams. Immunizations. Screening for certain conditions. Healthy lifestyle choices, such as: Eating a healthy diet. Getting regular exercise. Not using drugs or products that contain nicotine and tobacco. Limiting alcohol use. What can I expect for my preventive care visit? Physical exam Your health care provider may check your: Height and weight. These may be used to calculate your BMI (body mass index). BMI is a measurement that tells if you are at a healthy weight. Heart rate and blood pressure. Body temperature. Skin for abnormal spots. Counseling Your health care provider may ask you questions about your: Past medical problems. Family's medical history. Alcohol, tobacco, and drug use. Emotional well-being. Home life and relationship well-being. Sexual activity. Diet, exercise, and sleep habits. Work and work environment. Access to firearms. Method of birth control. Menstrual cycle. Pregnancy history. What immunizations do I need?  Vaccines are usually given at various ages, according to a schedule. Your health care provider will recommend vaccines for you based on your age, medicalhistory, and lifestyle or other factors, such as travel or where you work. What tests do I need?  Blood tests Lipid and cholesterol levels. These may be checked every 5 years starting at age 20. Hepatitis C test. Hepatitis B test. Screening Diabetes screening. This is done by checking your blood sugar (glucose) after you have not eaten for a while (fasting). STD (sexually transmitted disease) testing, if you are at risk. BRCA-related cancer screening. This may be done if you have a family history of breast, ovarian, tubal, or  peritoneal cancers. Pelvic exam and Pap test. This may be done every 3 years starting at age 21. Starting at age 30, this may be done every 5 years if you have a Pap test in combination with an HPV test. Talk with your health care provider about your test results, treatment options,and if necessary, the need for more tests. Follow these instructions at home: Eating and drinking  Eat a healthy diet that includes fresh fruits and vegetables, whole grains, lean protein, and low-fat dairy products. Take vitamin and mineral supplements as recommended by your health care provider. Do not drink alcohol if: Your health care provider tells you not to drink. You are pregnant, may be pregnant, or are planning to become pregnant. If you drink alcohol: Limit how much you have to 0-1 drink a day. Be aware of how much alcohol is in your drink. In the [place] one drink equals one 12 oz bottle of beer (355 mL), one 5 oz glass of wine (148 mL), or one 1 oz glass of hard liquor (44 mL).  Lifestyle Take daily care of your teeth and gums. Brush your teeth every morning and night with fluoride toothpaste. Floss one time each day. Stay active. Exercise for at least 30 minutes 5 or more days each week. Do not use any products that contain nicotine or tobacco, such as cigarettes, e-cigarettes, and chewing tobacco. If you need help quitting, ask your health care provider. Do not use drugs. If you are sexually active, practice safe sex. Use a condom or other form of protection to prevent STIs (sexually transmitted infections). If you do not wish to become pregnant, use a form of birth control. If you plan to become pregnant, see your health care   provider for a prepregnancy visit. Find healthy ways to cope with stress, such as: Meditation, yoga, or listening to music. Journaling. Talking to a trusted person. Spending time with friends and family. Safety Always wear your seat belt while driving or riding in a  vehicle. Do not drive: If you have been drinking alcohol. Do not ride with someone who has been drinking. When you are tired or distracted. While texting. Wear a helmet and other protective equipment during sports activities. If you have firearms in your house, make sure you follow all gun safety procedures. Seek help if you have been physically or sexually abused. What's next? Go to your health care provider once a year for an annual wellness visit. Ask your health care provider how often you should have your eyes and teeth checked. Stay up to date on all vaccines. This information is not intended to replace advice given to you by your health care provider. Make sure you discuss any questions you have with your healthcare provider. Document Revised: 08/01/2020 Document Reviewed: 08/15/2018 Elsevier Patient Education  [place] Maintenance  Topic Date Due   Flu Shot  07/18/2021   Pap Smear  02/24/2022   Tetanus Vaccine  01/27/2029   COVID-19 Vaccine  Completed   Hepatitis C Screening: USPSTF Recommendation to screen - Ages 18-79 yo.  Completed   HIV Screening  Completed   Pneumococcal Vaccination  Aged Out   HPV Vaccine  Aged Out    It's important to get 150 minutes of physical activity weekly - look up AHA exercise recommendations - don't underestimate the benefit of wwalking  Encourage healthy diet:  eating lean meats and proteins, avoiding trans fats and saturated fats, avoid simple sugars and excessive carbs in diet, eat 6 servings of fruit/vegetables daily and drink plenty of water and avoid sweet beverages.    Recommended doing annual eye exam and routine dental exams/cleanings

## 2021-06-07 LAB — COMPLETE METABOLIC PANEL WITH GFR
AG Ratio: 1.5 (calc) (ref 1.0–2.5)
ALT: 17 U/L (ref 6–29)
AST: 15 U/L (ref 10–30)
Albumin: 4.4 g/dL (ref 3.6–5.1)
Alkaline phosphatase (APISO): 24 U/L — ABNORMAL LOW (ref 31–125)
BUN: 9 mg/dL (ref 7–25)
CO2: 31 mmol/L (ref 20–32)
Calcium: 9.5 mg/dL (ref 8.6–10.2)
Chloride: 104 mmol/L (ref 98–110)
Creat: 0.68 mg/dL (ref 0.50–1.10)
GFR, Est African American: 131 mL/min/{1.73_m2} (ref >=60)
GFR, Est Non African American: 113 mL/min/{1.73_m2} (ref >=60)
Globulin: 3 g/dL (calc) (ref 1.9–3.7)
Glucose, Bld: 62 mg/dL — ABNORMAL LOW (ref 65–99)
Potassium: 4.7 mmol/L (ref 3.5–5.3)
Sodium: 140 mmol/L (ref 135–146)
Total Bilirubin: 0.4 mg/dL (ref 0.2–1.2)
Total Protein: 7.4 g/dL (ref 6.1–8.1)

## 2021-06-07 LAB — CBC WITH DIFFERENTIAL/PLATELET
Absolute Monocytes: 638 cells/uL (ref 200–950)
Basophils Absolute: 58 cells/uL (ref 0–200)
Basophils Relative: 1 %
Eosinophils Absolute: 128 cells/uL (ref 15–500)
Eosinophils Relative: 2.2 %
HCT: 37.6 % (ref 35.0–45.0)
Hemoglobin: 12.2 g/dL (ref 11.7–15.5)
Lymphs Abs: 1525 cells/uL (ref 850–3900)
MCH: 29.1 pg (ref 27.0–33.0)
MCHC: 32.4 g/dL (ref 32.0–36.0)
MCV: 89.7 fL (ref 80.0–100.0)
MPV: 9.6 fL (ref 7.5–12.5)
Monocytes Relative: 11 %
Neutro Abs: 3451 cells/uL (ref 1500–7800)
Neutrophils Relative %: 59.5 %
Platelets: 438 10*3/uL — ABNORMAL HIGH (ref 140–400)
RBC: 4.19 10*6/uL (ref 3.80–5.10)
RDW: 12.1 % (ref 11.0–15.0)
Total Lymphocyte: 26.3 %
WBC: 5.8 10*3/uL (ref 3.8–10.8)

## 2021-06-07 LAB — LIPID PANEL
Cholesterol: 227 mg/dL — ABNORMAL HIGH (ref <200)
HDL: 72 mg/dL (ref >=50)
LDL Cholesterol (Calc): 135 mg/dL (calc) — ABNORMAL HIGH
Non-HDL Cholesterol (Calc): 155 mg/dL (calc) — ABNORMAL HIGH (ref <130)
Total CHOL/HDL Ratio: 3.2 (calc) (ref <5.0)
Triglycerides: 94 mg/dL (ref <150)

## 2021-06-07 LAB — TSH: TSH: 2.61 mIU/L

## 2022-06-05 ENCOUNTER — Encounter: Payer: BC Managed Care – PPO | Admitting: Family Medicine

## 2022-06-19 NOTE — Patient Instructions (Incomplete)

## 2022-06-22 ENCOUNTER — Encounter: Payer: Self-pay | Admitting: Family Medicine

## 2022-06-22 ENCOUNTER — Ambulatory Visit (INDEPENDENT_AMBULATORY_CARE_PROVIDER_SITE_OTHER): Payer: BC Managed Care – PPO | Admitting: Family Medicine

## 2022-06-22 ENCOUNTER — Other Ambulatory Visit (HOSPITAL_COMMUNITY)
Admission: RE | Admit: 2022-06-22 | Discharge: 2022-06-22 | Disposition: A | Discharge status: Home / Self Care | Payer: BC Managed Care – PPO | Source: Ambulatory Visit | Attending: Family Medicine | Admitting: Family Medicine

## 2022-06-22 VITALS — BP 118/74 | HR 81 | Temp 98.5°F | Resp 16 | Ht 68.0 in | Wt 152.3 lb

## 2022-06-22 DIAGNOSIS — J309 Allergic rhinitis, unspecified: Secondary | ICD-10-CM

## 2022-06-22 DIAGNOSIS — E785 Hyperlipidemia, unspecified: Secondary | ICD-10-CM | POA: Diagnosis not present

## 2022-06-22 DIAGNOSIS — Z124 Encounter for screening for malignant neoplasm of cervix: Secondary | ICD-10-CM | POA: Diagnosis present

## 2022-06-22 DIAGNOSIS — E162 Hypoglycemia, unspecified: Secondary | ICD-10-CM | POA: Diagnosis not present

## 2022-06-22 DIAGNOSIS — Z0000 Encounter for general adult medical examination without abnormal findings: Secondary | ICD-10-CM | POA: Diagnosis not present

## 2022-06-22 DIAGNOSIS — E040 Nontoxic diffuse goiter: Secondary | ICD-10-CM

## 2022-06-22 DIAGNOSIS — H6981 Other specified disorders of Eustachian tube, right ear: Secondary | ICD-10-CM

## 2022-06-22 NOTE — Progress Notes (Signed)
Patient: Gabrielle Ali, Female    DOB: [birth year], 36 y.o.   MRN: [id number] Delsa Grana, PA-C Visit Date: 06/22/2022  Today's Provider: Delsa Grana, PA-C   Chief Complaint  Patient presents with   Annual Exam   Subjective:   Annual physical exam:  Gabrielle Ali is a 36 y.o. female who presents today for complete physical exam:  Exercise/Activity: Patient is fairly active, she avoids very strenuous activity but does yoga Pilates, at least 3 to 4 days a week Diet/nutrition: She snacks often, no particular diet Sleep: Sleeps well  SDOH Screenings   Alcohol Screen: Low Risk  (06/22/2022)   Alcohol Screen    Last Alcohol Screening Score (AUDIT): 0  Depression (PHQ2-9): Low Risk  (06/22/2022)   Depression (PHQ2-9)    PHQ-2 Score: 0  Financial Resource Strain: Low Risk  (06/22/2022)   Overall Financial Resource Strain (CARDIA)    Difficulty of Paying Living Expenses: Not hard at all  Food Insecurity: No Food Insecurity (06/22/2022)   Hunger Vital Sign    Worried About Running Out of Food in the Last Year: Never true    [place] in the Last Year: Never true  Housing: Low Risk  (06/22/2022)   Housing    Last Housing Risk Score: 0  Physical Activity: Insufficiently Active (06/22/2022)   Exercise Vital Sign    Days of Exercise per Week: 4 days    Minutes of Exercise per Session: 30 min  Social Connections: Socially Isolated (06/22/2022)   Social Connection and Isolation Panel [NHANES]    Frequency of Communication with Friends and Family: More than three times a week    Frequency of Social Gatherings with Friends and Family: More than three times a week    Attends Religious Services: Never    Marine scientist or Organizations: No    Attends Archivist Meetings: Never    Marital Status: Never married  Stress: No Stress Concern Present (06/22/2022)   [employer] [place]    Feeling of Stress : Only  a little  Tobacco Use: Low Risk  (06/22/2022)   Patient History    Smoking Tobacco Use: Never    Smokeless Tobacco Use: Never    Passive Exposure: Not on file  Transportation Needs: No Transportation Needs (06/22/2022)   PRAPARE - Transportation    Lack of Transportation (Medical): No    Lack of Transportation (Non-Medical): No       USPSTF grade A and B recommendations - reviewed and addressed today  Depression:  Phq 9 completed today by patient, was reviewed by me with patient in the room PHQ score is neg, pt feels good    06/22/2022    3:03 PM 06/03/2021   10:07 AM 06/01/2020    1:15 PM 02/25/2019   10:20 AM  PHQ 2/9 Scores  PHQ - 2 Score 0 0 0 0  PHQ- 9 Score 0 0 0 0      06/22/2022    3:03 PM 06/03/2021   10:07 AM 06/01/2020    1:15 PM 02/25/2019   10:20 AM 01/27/2019   10:54 AM  Depression screen PHQ 2/9  Decreased Interest 0 0 0 0 0  Down, Depressed, Hopeless 0 0 0 0 0  PHQ - 2 Score 0 0 0 0 0  Altered sleeping 0 0 0 0 0  Tired, decreased energy 0 0 0 0 0  Change in appetite 0 0  0 0 0  Feeling bad or failure about yourself  0 0 0 0 0  Trouble concentrating 0 0 0 0 0  Moving slowly or fidgety/restless 0 0 0 0 0  Suicidal thoughts 0 0 0 0 0  PHQ-9 Score 0 0 0 0 0  Difficult doing work/chores Not difficult at all Not difficult at all Not difficult at all  Not difficult at all    Alcohol screening: [place] Office Visit from 06/22/2022 in [facility]  AUDIT-C Score 0       Immunizations and Health Maintenance: Health Maintenance  Topic Date Due   COVID-19 Vaccine (4 - Pfizer series) 01/13/2021   PAP SMEAR-Modifier  02/24/2022   INFLUENZA VACCINE  07/18/2022   TETANUS/TDAP  01/27/2029   Hepatitis C Screening  Completed   HIV Screening  Completed   HPV VACCINES  Aged Out     Hep C Screening: done  STD testing and prevention (HIV/chl/gon/syphilis):  see above, no additional testing desired by pt today No partners in the past 12  months   Intimate partner violence: safe  Lives with family    Sexual History/Pain during Intercourse: Single  Menstrual History/LMP/Abnormal Bleeding: regular cycles more clots and slightly shorter Patient's last menstrual period was 06/07/2022 (exact date).  Incontinence Symptoms: Denies any incontinence symptoms  Breast cancer: Not indicated for age and family history Last Mammogram: *see HM list above BRCA gene screening:   Cervical cancer screening: Due, last Pap was done in 2020 which was negative, no HPV done Pt denies family hx of cancers - breast, ovarian, uterine, colon:     Osteoporosis:   Discussion on osteoporosis per age, including high calcium and vitamin D supplementation, weight bearing exercises  Skin cancer:  Hx of skin CA -  NO Discussed atypical lesions   Colorectal cancer:   Colonoscopy is not due per age Discussed concerning signs and sx of CRC, pt denies change in bowels, constipation, blood in stool  Lung cancer:   Low Dose CT Chest recommended if Age 54-80 years, 20 pack-year currently smoking OR have quit w/in 15years. Patient does not qualify.    Social History   Tobacco Use   Smoking status: Never   Smokeless tobacco: Never  Vaping Use   Vaping Use: Never used  Substance Use Topics   Alcohol use: Not Currently   Drug use: Never     [place] Office Visit from 06/22/2022 in [facility]  AUDIT-C Score 0       Family History  Problem Relation Age of Onset   Kidney Stones Mother    Glaucoma Maternal Grandmother    Pneumonia Maternal Grandfather        Passed from pneumonia   Parkinson's disease Paternal Grandfather      Blood pressure/Hypertension: BP Readings from Last 3 Encounters:  06/22/22 118/74  06/03/21 112/64  06/01/20 112/72    Weight/Obesity: Wt Readings from Last 3 Encounters:  06/22/22 152 lb 4.8 oz (69.1 kg)  06/03/21 156 lb 9.6 oz (71 kg)  06/01/20 158 lb 1.6 oz (71.7 kg)   BMI  Readings from Last 3 Encounters:  06/22/22 23.16 kg/m  06/03/21 23.13 kg/m  06/01/20 23.35 kg/m     Lipids:  Lab Results  Component Value Date   CHOL 227 (H) 06/06/2021   CHOL 206 (H) 01/27/2019   Lab Results  Component Value Date   HDL 72 06/06/2021   HDL 68 01/27/2019   Lab Results  Component Value Date   LDLCALC 135 (H) 06/06/2021   LDLCALC 119 (H) 01/27/2019   Lab Results  Component Value Date   TRIG 94 06/06/2021   TRIG 91 01/27/2019   Lab Results  Component Value Date   CHOLHDL 3.2 06/06/2021   CHOLHDL 3.0 01/27/2019   No results found for: "LDLDIRECT" Based on the results of lipid panel his/her cardiovascular risk factor ( using [facility] )  in the next 10 years is: The ASCVD Risk score (Arnett DK, et al., 2019) failed to calculate for the following reasons:   The 2019 ASCVD risk score is only valid for ages 22 to 59  Glucose:  Glucose, Bld  Date Value Ref Range Status  06/06/2021 62 (L) 65 - 99 mg/dL Final    Comment:    .            Fasting reference interval .     Advanced Care Planning:  A voluntary discussion about advance care planning including the explanation and discussion of advance directives.   Discussed health care proxy and Living will, and the patient was able to identify a health care proxy as parents mary helen Hafford.   Patient does not have a living will at present time.   Social History       Social History   Socioeconomic History   Marital status: Single    Spouse name: Not on file   Number of children: 0   Years of education: Not on file   Highest education level: Bachelor's degree (e.g., BA, AB, BS)  Occupational History    Employer: [employer]  Tobacco Use   Smoking status: Never   Smokeless tobacco: Never  Vaping Use   Vaping Use: Never used  Substance and Sexual Activity   Alcohol use: Not Currently   Drug use: Never   Sexual activity: Not Currently    Partners: Male    Birth control/protection:  None  Other Topics Concern   Not on file  Social History Narrative   Lives with her parents and her dog   Works at [employer] in [place] Strain: [place]  (06/22/2022)   Overall Financial Resource Strain (CARDIA)    Difficulty of Paying Living Expenses: Not hard at all  Food Insecurity: No Food Insecurity (06/22/2022)   Hunger Vital Sign    Worried About Running Out of Food in the Last Year: Never true    Ran Out of Food in the Last Year: Never true  Transportation Needs: No Transportation Needs (06/22/2022)   PRAPARE - Hydrologist (Medical): No    Lack of Transportation (Non-Medical): No  Physical Activity: Insufficiently Active (06/22/2022)   Exercise Vital Sign    Days of Exercise per Week: 4 days    Minutes of Exercise per Session: 30 min  Stress: No Stress Concern Present (06/22/2022)   [place]    Feeling of Stress : Only a little  Social Connections: Socially Isolated (06/22/2022)   Social Connection and Isolation Panel [NHANES]    Frequency of Communication with Friends and Family: More than three times a week    Frequency of Social Gatherings with Friends and Family: More than three times a week    Attends Religious Services: Never    Marine scientist or Organizations: No    Attends Archivist Meetings: Never  Marital Status: Never married    Family History        Family History  Problem Relation Age of Onset   Kidney Stones Mother    Glaucoma Maternal Grandmother    Pneumonia Maternal Grandfather        Passed from pneumonia   Parkinson's disease Paternal Grandfather     Patient Active Problem List   Diagnosis Date Noted   Allergic rhinitis 06/03/2021   Hyperlipidemia 06/03/2021   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   Palpitations 01/27/2019   Cold intolerance 01/27/2019    Past Surgical  History:  Procedure Laterality Date   NO PAST SURGERIES       Current Outpatient Medications:    cetirizine (ZYRTEC) 10 MG tablet, Take 10 mg by mouth daily., Disp: , Rfl:    fluticasone (FLONASE) 50 MCG/ACT nasal spray, Place 2 sprays into both nostrils daily., Disp: 16 g, Rfl: 6  No Known Allergies  Patient Care Team: Delsa Grana, PA-C as PCP - General (Family Medicine)   Chart Review: I personally reviewed active problem list, medication list, allergies, family history, social history, health maintenance, notes from last encounter, lab results, imaging with the patient/caregiver today.   Review of Systems  Constitutional: Negative.   HENT: Negative.    Eyes: Negative.   Respiratory: Negative.    Cardiovascular: Negative.   Gastrointestinal: Negative.   Endocrine: Negative.   Genitourinary: Negative.   Musculoskeletal: Negative.   Skin: Negative.   Allergic/Immunologic: Negative.   Neurological: Negative.   Hematological: Negative.   Psychiatric/Behavioral: Negative.    All other systems reviewed and are negative.         Objective:   Vitals:  Vitals:   06/22/22 1505  BP: 118/74  Pulse: 81  Resp: 16  Temp: 98.5 F (36.9 C)  TempSrc: Oral  SpO2: 100%  Weight: 152 lb 4.8 oz (69.1 kg)  Height: '5\' 8"'  (1.727 m)    Body mass index is 23.16 kg/m.  Physical Exam Vitals and nursing note reviewed. Exam conducted with a chaperone present.  Constitutional:      General: She is not in acute distress.    Appearance: Normal appearance. She is well-developed and normal weight. She is not ill-appearing, toxic-appearing or diaphoretic.  HENT:     Head: Normocephalic and atraumatic.     Right Ear: External ear normal.     Left Ear: External ear normal.     Nose: Nose normal.     Mouth/Throat:     Pharynx: Uvula midline.  Eyes:     General: Lids are normal.        Right eye: No discharge.        Left eye: No discharge.     Conjunctiva/sclera: Conjunctivae  normal.     Pupils: Pupils are equal, round, and reactive to light.  Neck:     Thyroid: Thyromegaly present. No thyroid mass or thyroid tenderness.     Trachea: Trachea and phonation normal. No tracheal deviation.  Cardiovascular:     Rate and Rhythm: Normal rate and regular rhythm.     Pulses: Normal pulses.          Radial pulses are 2+ on the right side and 2+ on the left side.       Posterior tibial pulses are 2+ on the right side and 2+ on the left side.     Heart sounds: Normal heart sounds. No murmur heard.    No friction rub. No gallop.  Pulmonary:  Effort: Pulmonary effort is normal. No respiratory distress.     Breath sounds: Normal breath sounds. No stridor. No wheezing, rhonchi or rales.  Chest:     Chest wall: No tenderness.  Abdominal:     General: Bowel sounds are normal. There is no distension.     Palpations: Abdomen is soft.     Tenderness: There is no abdominal tenderness. There is no guarding or rebound.  Genitourinary:    Vagina: Normal.     Cervix: Friability and eversion present. No cervical motion tenderness, discharge, lesion or erythema.     Uterus: Normal.      Adnexa: Right adnexa normal and left adnexa normal.     Comments: PAP obtained Bimanual done and neg Musculoskeletal:        General: No deformity. Normal range of motion.     Cervical back: Normal range of motion and neck supple.  Lymphadenopathy:     Cervical: No cervical adenopathy.  Skin:    General: Skin is warm and dry.     Coloration: Skin is not pale.     Findings: No rash.  Neurological:     Mental Status: She is alert. Mental status is at baseline.     Motor: No abnormal muscle tone.     Gait: Gait normal.  Psychiatric:        Mood and Affect: Mood normal.        Speech: Speech normal.        Behavior: Behavior normal.       Fall Risk:    06/22/2022    3:02 PM 06/03/2021   10:07 AM 06/01/2020    1:14 PM 02/25/2019   10:19 AM 01/27/2019   10:53 AM  Fall Risk   Falls in  the past year? 0 0 0 0 0  Number falls in past yr: 0 0 0 0 0  Injury with Fall? 0 0 0 0   Risk for fall due to : No Fall Risks      Follow up Falls prevention discussed;Education provided  Falls evaluation completed  Falls evaluation completed    Functional Status Survey: Is the patient deaf or have difficulty hearing?: No Does the patient have difficulty seeing, even when wearing glasses/contacts?: No Does the patient have difficulty concentrating, remembering, or making decisions?: No Does the patient have difficulty walking or climbing stairs?: No Does the patient have difficulty dressing or bathing?: No Does the patient have difficulty doing errands alone such as visiting a doctor's office or shopping?: No   Assessment & Plan:    CPE completed today  USPSTF grade A and B recommendations reviewed with patient; age-appropriate recommendations, preventive care, screening tests, etc discussed and encouraged; healthy living encouraged; see AVS for patient education given to patient  Discussed importance of 150 minutes of physical activity weekly, AHA exercise recommendations given to pt in AVS/handout  Discussed importance of healthy diet:  eating lean meats and proteins, avoiding trans fats and saturated fats, avoid simple sugars and excessive carbs in diet, eat 6 servings of fruit/vegetables daily and drink plenty of water and avoid sweet beverages.    Recommended pt to do annual eye exam and routine dental exams/cleanings  Depression, alcohol, fall screening completed as documented above and per flowsheets  Advance Care planning information and packet discussed and offered today, encouraged pt to discuss with family members/spouse/partner/friends and complete Advanced directive packet and bring copy to office   Reviewed Health Maintenance: Health Maintenance  Topic Date Due  COVID-19 Vaccine (4 - Pfizer series) 01/13/2021   PAP SMEAR-Modifier  02/24/2022   INFLUENZA VACCINE   07/18/2022   TETANUS/TDAP  01/27/2029   Hepatitis C Screening  Completed   HIV Screening  Completed   HPV VACCINES  Aged Out    Immunizations: Immunization History  Administered Date(s) Administered   Influenza,inj,Quad PF,6+ Mos 08/27/2019   Influenza-Unspecified 08/28/2017, 09/12/2018, 08/25/2020   MMR 10/12/2018   Meningococcal Conjugate 07/08/2004   PFIZER(Purple Top)SARS-COV-2 Vaccination 02/22/2020, 03/17/2020, 11/18/2020   Tdap 05/16/2002, 01/27/2019   Vaccines:  HPV: up to at age 62 , ask insurance if age between 65-45  Shingrix: 52-64 yo and ask insurance if covered when patient above 3 yo Pneumonia:  educated and discussed with patient. Flu:  educated and discussed with patient. COVID:      ICD-10-CM   1. Annual physical exam  Z00.00 CBC with Differential/Platelet    COMPLETE METABOLIC PANEL WITH GFR    Lipid panel    2. Hyperlipidemia, unspecified hyperlipidemia type  E78.5 CBC with Differential/Platelet    COMPLETE METABOLIC PANEL WITH GFR    Lipid panel   She has worked on [employer] and increasing exercise and lost a few pounds    3. Screening for cervical cancer  Z12.4 Cytology - PAP    CANCELED: Cytology - PAP   Not sexually active for several years, cervix very friable difficult to get endocervical canal sample    4. Hypoglycemia  E16.2    She denies any recent symptoms, last labs glucose was 62 encourage complex carbs and proteins and small meals every couple hours    5. Goiter diffuse  E04.0 Thyroid Panel With TSH   No change in size, no tenderness, will continue to monitor thyroid levels past ultrasound was reviewed today suggestive of prior thyroiditis          Delsa Grana, PA-C 06/22/22 3:21 PM  [facility]

## 2022-06-23 LAB — CBC WITH DIFFERENTIAL/PLATELET
Absolute Monocytes: 655 cells/uL (ref 200–950)
Basophils Absolute: 38 cells/uL (ref 0–200)
Basophils Relative: 0.6 %
Eosinophils Absolute: 101 cells/uL (ref 15–500)
Eosinophils Relative: 1.6 %
HCT: 36.7 % (ref 35.0–45.0)
Hemoglobin: 12 g/dL (ref 11.7–15.5)
Lymphs Abs: 1726 cells/uL (ref 850–3900)
MCH: 29.3 pg (ref 27.0–33.0)
MCHC: 32.7 g/dL (ref 32.0–36.0)
MCV: 89.5 fL (ref 80.0–100.0)
MPV: 10.4 fL (ref 7.5–12.5)
Monocytes Relative: 10.4 %
Neutro Abs: 3780 cells/uL (ref 1500–7800)
Neutrophils Relative %: 60 %
Platelets: 368 10*3/uL (ref 140–400)
RBC: 4.1 10*6/uL (ref 3.80–5.10)
RDW: 12.4 % (ref 11.0–15.0)
Total Lymphocyte: 27.4 %
WBC: 6.3 10*3/uL (ref 3.8–10.8)

## 2022-06-23 LAB — COMPLETE METABOLIC PANEL WITH GFR
AG Ratio: 1.5 (calc) (ref 1.0–2.5)
ALT: 14 U/L (ref 6–29)
AST: 15 U/L (ref 10–30)
Albumin: 4.6 g/dL (ref 3.6–5.1)
Alkaline phosphatase (APISO): 22 U/L — ABNORMAL LOW (ref 31–125)
BUN: 13 mg/dL (ref 7–25)
CO2: 27 mmol/L (ref 20–32)
Calcium: 9.4 mg/dL (ref 8.6–10.2)
Chloride: 104 mmol/L (ref 98–110)
Creat: 0.85 mg/dL (ref 0.50–0.97)
Globulin: 3.1 g/dL (calc) (ref 1.9–3.7)
Glucose, Bld: 82 mg/dL (ref 65–99)
Potassium: 4.8 mmol/L (ref 3.5–5.3)
Sodium: 138 mmol/L (ref 135–146)
Total Bilirubin: 0.3 mg/dL (ref 0.2–1.2)
Total Protein: 7.7 g/dL (ref 6.1–8.1)
eGFR: 91 mL/min/{1.73_m2} (ref >=60)

## 2022-06-23 LAB — LIPID PANEL
Cholesterol: 215 mg/dL — ABNORMAL HIGH (ref <200)
HDL: 71 mg/dL (ref >=50)
LDL Cholesterol (Calc): 124 mg/dL (calc) — ABNORMAL HIGH
Non-HDL Cholesterol (Calc): 144 mg/dL (calc) — ABNORMAL HIGH (ref <130)
Total CHOL/HDL Ratio: 3 (calc) (ref <5.0)
Triglycerides: 92 mg/dL (ref <150)

## 2022-06-23 LAB — THYROID PANEL WITH TSH
Free Thyroxine Index: 2.1 (ref 1.4–3.8)
T3 Uptake: 28 % (ref 22–35)
T4, Total: 7.6 ug/dL (ref 5.1–11.9)
TSH: 2.61 mIU/L

## 2022-06-26 LAB — CYTOLOGY - PAP
Chlamydia: NEGATIVE
Comment: NEGATIVE
Comment: NEGATIVE
Comment: NEGATIVE
Comment: NORMAL
Diagnosis: NEGATIVE
High risk HPV: NEGATIVE
Neisseria Gonorrhea: NEGATIVE
Trichomonas: NEGATIVE

## 2023-06-28 ENCOUNTER — Encounter: Payer: BC Managed Care – PPO | Admitting: Family Medicine

## 2023-07-16 ENCOUNTER — Encounter: Payer: Self-pay | Admitting: Family Medicine

## 2023-07-16 ENCOUNTER — Ambulatory Visit (INDEPENDENT_AMBULATORY_CARE_PROVIDER_SITE_OTHER): Payer: BC Managed Care – PPO | Admitting: Family Medicine

## 2023-07-16 VITALS — BP 112/76 | HR 89 | Temp 98.1°F | Resp 16 | Ht 68.0 in | Wt 155.7 lb

## 2023-07-16 DIAGNOSIS — Z0000 Encounter for general adult medical examination without abnormal findings: Secondary | ICD-10-CM | POA: Diagnosis not present

## 2023-07-16 DIAGNOSIS — E040 Nontoxic diffuse goiter: Secondary | ICD-10-CM

## 2023-07-16 DIAGNOSIS — E162 Hypoglycemia, unspecified: Secondary | ICD-10-CM

## 2023-07-16 DIAGNOSIS — E785 Hyperlipidemia, unspecified: Secondary | ICD-10-CM | POA: Diagnosis not present

## 2023-07-16 NOTE — Progress Notes (Signed)
Patient: Gabrielle Ali, Female    DOB: 1986-08-30, 37 y.o.   MRN: [id number] Danelle Berry, PA-C Visit Date: 07/16/2023  Today's Provider: Danelle Berry, PA-C   Chief Complaint  Patient presents with   Annual Exam   Subjective:   Annual physical exam:  Gabrielle Ali is a 37 y.o. female who presents today for complete physical exam:   SDOH Screenings   Food Insecurity: No Food Insecurity (07/16/2023)  Housing: Low Risk  (07/16/2023)  Transportation Needs: No Transportation Needs (07/16/2023)  Utilities: Not At Risk (07/16/2023)  Alcohol Screen: Low Risk  (07/16/2023)  Depression (PHQ2-9): Low Risk  (07/16/2023)  Financial Resource Strain: Low Risk  (07/16/2023)  Physical Activity: Insufficiently Active (07/16/2023)  Social Connections: Socially Isolated (07/16/2023)  Stress: No Stress Concern Present (07/16/2023)  Tobacco Use: Low Risk  (07/16/2023)  Health Literacy: Adequate Health Literacy (07/16/2023)     USPSTF grade A and B recommendations - reviewed and addressed today  Depression:  Phq 9 completed today by patient, was reviewed by me with patient in the room PHQ score is neg, pt feels good    07/16/2023    8:37 AM 06/22/2022    3:03 PM 06/03/2021   10:07 AM 06/01/2020    1:15 PM  PHQ 2/9 Scores  PHQ - 2 Score 0 0 0 0  PHQ- 9 Score 0 0 0 0      07/16/2023    8:37 AM 06/22/2022    3:03 PM 06/03/2021   10:07 AM 06/01/2020    1:15 PM 02/25/2019   10:20 AM  Depression screen PHQ 2/9  Decreased Interest 0 0 0 0 0  Down, Depressed, Hopeless 0 0 0 0 0  PHQ - 2 Score 0 0 0 0 0  Altered sleeping 0 0 0 0 0  Tired, decreased energy 0 0 0 0 0  Change in appetite 0 0 0 0 0  Feeling bad or failure about yourself  0 0 0 0 0  Trouble concentrating 0 0 0 0 0  Moving slowly or fidgety/restless 0 0 0 0 0  Suicidal thoughts 0 0 0 0 0  PHQ-9 Score 0 0 0 0 0  Difficult doing work/chores Not difficult at all Not difficult at all Not difficult at all Not difficult at all      Alcohol screening: Flowsheet Row Office Visit from 07/16/2023 in [facility]  AUDIT-C Score 0       Immunizations and Health Maintenance: Health Maintenance  Topic Date Due   COVID-19 Vaccine (5 - 2023-24 season) 08/01/2023 (Originally 11/28/2022)   INFLUENZA VACCINE  07/19/2023   PAP SMEAR-Modifier  06/23/2027   DTaP/Tdap/Td (3 - Td or Tdap) 01/27/2029   Hepatitis C Screening  Completed   HIV Screening  Completed   HPV VACCINES  Aged Out     Hep C Screening:  done  STD testing and prevention (HIV/chl/gon/syphilis):  see above, no additional testing desired by pt today  Intimate partner violence: safe  Sexual History/Pain during Intercourse: Single  Menstrual History/LMP/Abnormal Bleeding:   regular cycle lasts about 4 d only 1 day getting heavier Patient's last menstrual period was 07/04/2023 (approximate).  Incontinence Symptoms:  none  Breast cancer:    Last Mammogram: *see HM list above BRCA gene screening: none  Cervical cancer screening: done last year   Pt  family hx of cancers - breast, ovarian, uterine, colon:     Osteoporosis:   Discussion on osteoporosis per  age, including high calcium and vitamin D supplementation, weight bearing exercises  Skin cancer:  Hx of skin CA -  NO Discussed atypical lesions   Colorectal cancer:   Colonoscopy is not due  Discussed concerning signs and sx of CRC, pt denies   Lung cancer:   Low Dose CT Chest recommended if Age 107-80 years, 20 pack-year currently smoking OR have quit w/in 15years. Patient does not qualify.    Social History   Tobacco Use   Smoking status: Never   Smokeless tobacco: Never  Vaping Use   Vaping status: Never Used  Substance Use Topics   Alcohol use: Not Currently   Drug use: Never     Flowsheet Row Office Visit from 07/16/2023 in [facility]  AUDIT-C Score 0       Family History  Problem Relation Age of Onset   Kidney  Stones Mother    Glaucoma Maternal Grandmother    Pneumonia Maternal Grandfather        Passed from pneumonia   Schizophrenia Maternal Grandfather    Parkinson's disease Paternal Grandfather      Blood pressure/Hypertension: BP Readings from Last 3 Encounters:  07/16/23 112/76  06/22/22 118/74  06/03/21 112/64    Weight/Obesity: Wt Readings from Last 3 Encounters:  07/16/23 155 lb 11.2 oz (70.6 kg)  06/22/22 152 lb 4.8 oz (69.1 kg)  06/03/21 156 lb 9.6 oz (71 kg)   BMI Readings from Last 3 Encounters:  07/16/23 23.67 kg/m  06/22/22 23.16 kg/m  06/03/21 23.13 kg/m     Lipids:  Lab Results  Component Value Date   CHOL 215 (H) 06/22/2022   CHOL 227 (H) 06/06/2021   CHOL 206 (H) 01/27/2019   Lab Results  Component Value Date   HDL 71 06/22/2022   HDL 72 06/06/2021   HDL 68 01/27/2019   Lab Results  Component Value Date   LDLCALC 124 (H) 06/22/2022   LDLCALC 135 (H) 06/06/2021   LDLCALC 119 (H) 01/27/2019   Lab Results  Component Value Date   TRIG 92 06/22/2022   TRIG 94 06/06/2021   TRIG 91 01/27/2019   Lab Results  Component Value Date   CHOLHDL 3.0 06/22/2022   CHOLHDL 3.2 06/06/2021   CHOLHDL 3.0 01/27/2019   No results found for: "LDLDIRECT" Based on the results of lipid panel his/her cardiovascular risk factor ( using Poole Cohort )  in the next 10 years is: The ASCVD Risk score (Arnett DK, et al., 2019) failed to calculate for the following reasons:   The 2019 ASCVD risk score is only valid for ages 76 to 35  Glucose:  Glucose, Bld  Date Value Ref Range Status  06/22/2022 82 65 - 99 mg/dL Final    Comment:    .            Fasting reference interval .   06/06/2021 62 (L) 65 - 99 mg/dL Final    Comment:    .            Fasting reference interval .     Advanced Care Planning:  A voluntary discussion about advance care planning including the explanation and discussion of advance directives.     Social History       Social History    Socioeconomic History   Marital status: Single    Spouse name: Not on file   Number of children: 0   Years of education: Not on file   Highest education  level: Bachelor's degree (e.g., BA, AB, BS)  Occupational History    Employer: [employer]  Tobacco Use   Smoking status: Never   Smokeless tobacco: Never  Vaping Use   Vaping status: Never Used  Substance and Sexual Activity   Alcohol use: Not Currently   Drug use: Never   Sexual activity: Not Currently    Partners: Male    Birth control/protection: Abstinence, None  Other Topics Concern   Not on file  Social History Narrative   Lives with her parents and her dog   Works at [employer] in [employer] [place]   Social Determinants of Health   Financial Resource Strain: Low Risk  (07/16/2023)   Overall Financial Resource Strain (CARDIA)    Difficulty of Paying Living Expenses: Not hard at all  Food Insecurity: No Food Insecurity (07/16/2023)   Hunger Vital Sign    Worried About Running Out of Food in the Last Year: Never true    Ran Out of Food in the Last Year: Never true  Transportation Needs: No Transportation Needs (07/16/2023)   PRAPARE - Administrator, Civil Service (Medical): No    Lack of Transportation (Non-Medical): No  Physical Activity: Insufficiently Active (07/16/2023)   Exercise Vital Sign    Days of Exercise per Week: 4 days    Minutes of Exercise per Session: 30 min  Stress: No Stress Concern Present (07/16/2023)   [employer] of Occupational Health - Occupational Stress Questionnaire    Feeling of Stress : Not at all  Social Connections: Socially Isolated (07/16/2023)   Social Connection and Isolation Panel [NHANES]    Frequency of Communication with Friends and Family: More than three times a week    Frequency of Social Gatherings with Friends and Family: More than three times a week    Attends Religious Services: Never    Database administrator or Organizations: No     Attends Engineer, structural: Never    Marital Status: Never married    Family History        Family History  Problem Relation Age of Onset   Kidney Stones Mother    Glaucoma Maternal Grandmother    Pneumonia Maternal Grandfather        Passed from pneumonia   Schizophrenia Maternal Grandfather    Parkinson's disease Paternal Grandfather     Patient Active Problem List   Diagnosis Date Noted   Allergic rhinitis 06/03/2021   Hyperlipidemia 06/03/2021   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   Palpitations 01/27/2019   Cold intolerance 01/27/2019    Past Surgical History:  Procedure Laterality Date   NO PAST SURGERIES       Current Outpatient Medications:    cetirizine (ZYRTEC) 10 MG tablet, Take 10 mg by mouth daily., Disp: , Rfl:    fluticasone (FLONASE) 50 MCG/ACT nasal spray, Place 2 sprays into both nostrils daily., Disp: 16 g, Rfl: 6  No Known Allergies  Patient Care Team: Danelle Berry, PA-C as PCP - General (Family Medicine)   Chart Review: I personally reviewed active problem list, medication list, allergies, family history, social history, health maintenance, notes from last encounter, lab results, imaging with the patient/caregiver today.   Review of Systems  Constitutional: Negative.   HENT: Negative.    Eyes: Negative.   Respiratory: Negative.    Cardiovascular: Negative.   Gastrointestinal: Negative.   Endocrine: Negative.   Genitourinary: Negative.   Musculoskeletal: Negative.   Skin: Negative.   Allergic/Immunologic: Negative.  Neurological: Negative.   Hematological: Negative.   Psychiatric/Behavioral: Negative.    All other systems reviewed and are negative.         Objective:   Vitals:  Vitals:   07/16/23 0840  BP: 112/76  Pulse: 89  Resp: 16  Temp: 98.1 F (36.7 C)  TempSrc: Oral  SpO2: 99%  Weight: 155 lb 11.2 oz (70.6 kg)  Height: 5\' 8"  (1.727 m)    Body mass index is 23.67 kg/m.  Physical Exam Vitals and nursing note  reviewed.  Constitutional:      General: She is not in acute distress.    Appearance: Normal appearance. She is well-developed and normal weight. She is not ill-appearing, toxic-appearing or diaphoretic.  HENT:     Head: Normocephalic and atraumatic.     Right Ear: External ear normal.     Left Ear: External ear normal.     Nose: Nose normal.     Mouth/Throat:     Pharynx: Uvula midline.  Eyes:     General: Lids are normal.        Right eye: No discharge.        Left eye: No discharge.     Conjunctiva/sclera: Conjunctivae normal.     Pupils: Pupils are equal, round, and reactive to light.  Neck:     Thyroid: Thyromegaly present. No thyroid mass or thyroid tenderness.     Trachea: Trachea and phonation normal. No tracheal deviation.  Cardiovascular:     Rate and Rhythm: Normal rate and regular rhythm.     Pulses: Normal pulses.          Radial pulses are 2+ on the right side and 2+ on the left side.       Posterior tibial pulses are 2+ on the right side and 2+ on the left side.     Heart sounds: Normal heart sounds. No murmur heard.    No friction rub. No gallop.  Pulmonary:     Effort: Pulmonary effort is normal. No respiratory distress.     Breath sounds: Normal breath sounds. No stridor. No wheezing, rhonchi or rales.  Chest:     Chest wall: No tenderness.  Abdominal:     General: Bowel sounds are normal. There is no distension.     Palpations: Abdomen is soft.     Tenderness: There is no abdominal tenderness. There is no guarding or rebound.  Musculoskeletal:        General: No deformity. Normal range of motion.     Cervical back: Normal range of motion and neck supple.  Lymphadenopathy:     Cervical: No cervical adenopathy.  Skin:    General: Skin is warm and dry.     Coloration: Skin is not pale.     Findings: No rash.  Neurological:     Mental Status: She is alert. Mental status is at baseline.     Motor: No abnormal muscle tone.     Gait: Gait normal.   Psychiatric:        Mood and Affect: Mood normal.        Speech: Speech normal.        Behavior: Behavior normal.       Fall Risk:    07/16/2023    8:37 AM 06/22/2022    3:02 PM 06/03/2021   10:07 AM 06/01/2020    1:14 PM 02/25/2019   10:19 AM  Fall Risk   Falls in the past year? 0 0 0 0  0  Number falls in past yr: 0 0 0 0 0  Injury with Fall? 0 0 0 0 0  Risk for fall due to : No Fall Risks No Fall Risks     Follow up Falls prevention discussed;Education provided;Falls evaluation completed Falls prevention discussed;Education provided  Falls evaluation completed     Functional Status Survey: Is the patient deaf or have difficulty hearing?: No Does the patient have difficulty seeing, even when wearing glasses/contacts?: No Does the patient have difficulty concentrating, remembering, or making decisions?: No Does the patient have difficulty walking or climbing stairs?: No Does the patient have difficulty dressing or bathing?: No Does the patient have difficulty doing errands alone such as visiting a doctor's office or shopping?: No   Assessment & Plan:    CPE completed today  USPSTF grade A and B recommendations reviewed with patient; age-appropriate recommendations, preventive care, screening tests, etc discussed and encouraged; healthy living encouraged; see AVS for patient education given to patient  Discussed importance of 150 minutes of physical activity weekly, AHA exercise recommendations given to pt in AVS/handout  Discussed importance of healthy diet:  eating lean meats and proteins, avoiding trans fats and saturated fats, avoid simple sugars and excessive carbs in diet, eat 6 servings of fruit/vegetables daily and drink plenty of water and avoid sweet beverages.    Recommended pt to do annual eye exam and routine dental exams/cleanings  Depression, alcohol, fall screening completed as documented above and per flowsheets  Advance Care planning information and packet  discussed and offered today, encouraged pt to discuss with family members/spouse/partner/friends and complete Advanced directive packet and bring copy to office   Reviewed Health Maintenance: Health Maintenance  Topic Date Due   COVID-19 Vaccine (5 - 2023-24 season) 08/01/2023 (Originally 11/28/2022)   INFLUENZA VACCINE  07/19/2023   PAP SMEAR-Modifier  06/23/2027   DTaP/Tdap/Td (3 - Td or Tdap) 01/27/2029   Hepatitis C Screening  Completed   HIV Screening  Completed   HPV VACCINES  Aged Out    Immunizations: Immunization History  Administered Date(s) Administered   Influenza,inj,Quad PF,6+ Mos 08/27/2019   Influenza-Unspecified 09/12/2018, 08/25/2020, 10/03/2022   MMR 10/12/2018   Meningococcal Conjugate 07/08/2004   Moderna Sars-Covid-2 Vaccination 10/03/2022   PFIZER(Purple Top)SARS-COV-2 Vaccination 02/22/2020, 03/17/2020, 11/18/2020   Tdap 05/16/2002, 01/27/2019   Vaccines:  HPV: up to at age 73 , ask insurance if age between 39-45  Shingrix: 86-64 yo and ask insurance if covered when patient above 2 yo Pneumonia:  educated and discussed with patient. Flu:  educated and discussed with patient. COVID:      ICD-10-CM   1. Annual physical exam  Z00.00 COMPLETE METABOLIC PANEL WITH GFR    CBC with Differential/Platelet    Lipid panel    2. Hyperlipidemia, unspecified hyperlipidemia type  E78.5 COMPLETE METABOLIC PANEL WITH GFR    Lipid panel    3. Goiter diffuse  E04.0 Thyroid Panel With TSH   no change, no concerning sx    4. Hypoglycemia  E16.2 COMPLETE METABOLIC PANEL WITH GFR   no sx of low blood sugar          Danelle Berry, PA-C 07/16/23 9:08 AM  [facility]

## 2023-07-16 NOTE — Patient Instructions (Addendum)

## 2023-07-16 NOTE — Progress Notes (Deleted)
Patient: Gabrielle Ali, Female    DOB: Jul 02, 1986, 37 y.o.   MRN: [id number] Danelle Berry, PA-C Visit Date: 07/16/2023  Today's Provider: Danelle Berry, PA-C   Chief Complaint  Patient presents with  . Annual Exam   Subjective:   Annual physical exam:  Gabrielle Ali is a 37 y.o. female who presents today for complete physical exam:  Exercise/Activity: Diet/nutrition: Sleep:   SDOH Screenings   Food Insecurity: No Food Insecurity (07/16/2023)  Housing: Low Risk  (07/16/2023)  Transportation Needs: No Transportation Needs (07/16/2023)  Utilities: Not At Risk (07/16/2023)  Alcohol Screen: Low Risk  (07/16/2023)  Depression (PHQ2-9): Low Risk  (07/16/2023)  Financial Resource Strain: Low Risk  (07/16/2023)  Physical Activity: Insufficiently Active (07/16/2023)  Social Connections: Socially Isolated (07/16/2023)  Stress: No Stress Concern Present (07/16/2023)  Tobacco Use: Low Risk  (07/16/2023)  Health Literacy: Adequate Health Literacy (07/16/2023)     Pt wished  *** discuss acute complaints  *** do routine f/up on chronic conditions today in addition to CPE. Advised pt of separate visit billing/coding  USPSTF grade A and B recommendations - reviewed and addressed today  Depression:  Phq 9 completed today by patient, was reviewed by me with patient in the room PHQ score is ***, pt feels ***    07/16/2023    8:37 AM 06/22/2022    3:03 PM 06/03/2021   10:07 AM 06/01/2020    1:15 PM  PHQ 2/9 Scores  PHQ - 2 Score 0 0 0 0  PHQ- 9 Score 0 0 0 0      07/16/2023    8:37 AM 06/22/2022    3:03 PM 06/03/2021   10:07 AM 06/01/2020    1:15 PM 02/25/2019   10:20 AM  Depression screen PHQ 2/9  Decreased Interest 0 0 0 0 0  Down, Depressed, Hopeless 0 0 0 0 0  PHQ - 2 Score 0 0 0 0 0  Altered sleeping 0 0 0 0 0  Tired, decreased energy 0 0 0 0 0  Change in appetite 0 0 0 0 0  Feeling bad or failure about yourself  0 0 0 0 0  Trouble concentrating 0 0 0 0 0  Moving slowly  or fidgety/restless 0 0 0 0 0  Suicidal thoughts 0 0 0 0 0  PHQ-9 Score 0 0 0 0 0  Difficult doing work/chores Not difficult at all Not difficult at all Not difficult at all Not difficult at all     Alcohol screening: Flowsheet Row Office Visit from 07/16/2023 in [facility]  AUDIT-C Score 0       Immunizations and Health Maintenance: Health Maintenance  Topic Date Due  . COVID-19 Vaccine (5 - 2023-24 season) 08/01/2023 (Originally 11/28/2022)  . INFLUENZA VACCINE  07/19/2023  . PAP SMEAR-Modifier  06/23/2027  . DTaP/Tdap/Td (3 - Td or Tdap) 01/27/2029  . Hepatitis C Screening  Completed  . HIV Screening  Completed  . HPV VACCINES  Aged Out     Hep C Screening: ***  STD testing and prevention (HIV/chl/gon/syphilis):  see above, no additional testing desired by pt today  Intimate partner violence:***  Sexual History/Pain during Intercourse: Single  Menstrual History/LMP/Abnormal Bleeding: *** Patient's last menstrual period was 07/04/2023 (approximate).  Incontinence Symptoms: ***  Breast cancer:  Last Mammogram: *see HM list above BRCA gene screening: ***  Cervical cancer screening: *** Pt *** family hx of cancers - breast, ovarian, uterine, colon:  Osteoporosis:   Discussion on osteoporosis per age, including high calcium and vitamin D supplementation, weight bearing exercises Pt is *** supplementing with daily calcium/Vit D. *** Bone scan/dexa Roughly experienced menopause at age ***  Skin cancer:  Hx of skin CA -  NO*** Discussed atypical lesions   Colorectal cancer:   Colonoscopy is ***   Discussed concerning signs and sx of CRC, pt denies ***  Lung cancer:   Low Dose CT Chest recommended if Age 25-80 years, 20 pack-year currently smoking OR have quit w/in 15years. Patient {DOES NOT does:27190::"does not"} qualify.    Social History   Tobacco Use  . Smoking status: Never  . Smokeless tobacco: Never  Vaping Use  .  Vaping status: Never Used  Substance Use Topics  . Alcohol use: Not Currently  . Drug use: Never     Flowsheet Row Office Visit from 07/16/2023 in [facility]  AUDIT-C Score 0       Family History  Problem Relation Age of Onset  . Kidney Stones Mother   . Glaucoma Maternal Grandmother   . Pneumonia Maternal Grandfather        Passed from pneumonia  . Schizophrenia Maternal Grandfather   . Parkinson's disease Paternal Grandfather      Blood pressure/Hypertension: BP Readings from Last 3 Encounters:  07/16/23 112/76  06/22/22 118/74  06/03/21 112/64    Weight/Obesity: Wt Readings from Last 3 Encounters:  07/16/23 155 lb 11.2 oz (70.6 kg)  06/22/22 152 lb 4.8 oz (69.1 kg)  06/03/21 156 lb 9.6 oz (71 kg)   BMI Readings from Last 3 Encounters:  07/16/23 23.67 kg/m  06/22/22 23.16 kg/m  06/03/21 23.13 kg/m     Lipids:  Lab Results  Component Value Date   CHOL 215 (H) 06/22/2022   CHOL 227 (H) 06/06/2021   CHOL 206 (H) 01/27/2019   Lab Results  Component Value Date   HDL 71 06/22/2022   HDL 72 06/06/2021   HDL 68 01/27/2019   Lab Results  Component Value Date   LDLCALC 124 (H) 06/22/2022   LDLCALC 135 (H) 06/06/2021   LDLCALC 119 (H) 01/27/2019   Lab Results  Component Value Date   TRIG 92 06/22/2022   TRIG 94 06/06/2021   TRIG 91 01/27/2019   Lab Results  Component Value Date   CHOLHDL 3.0 06/22/2022   CHOLHDL 3.2 06/06/2021   CHOLHDL 3.0 01/27/2019   No results found for: "LDLDIRECT" Based on the results of lipid panel his/her cardiovascular risk factor ( using Po[place] Cohort )  in the next 10 years is: The ASCVD Risk score (Arnett DK, et al., 2019) failed to calculate for the following reasons:   The 2019 ASCVD risk score is only valid for ages 58 to 58  Glucose:  Glucose, Bld  Date Value Ref Range Status  06/22/2022 82 65 - 99 mg/dL Final    Comment:    .            Fasting reference interval .   06/06/2021  62 (L) 65 - 99 mg/dL Final    Comment:    .            Fasting reference interval .     Advanced Care Planning:  A voluntary discussion about advance care planning including the explanation and discussion of advance directives.   Discussed health care proxy and Living will, and the patient was able to identify a health care proxy as ***.  Patient {DOES_DOES WGN:56213} have a living will at present time.   Social History       Social History   Socioeconomic History  . Marital status: Single    Spouse name: Not on file  . Number of children: 0  . Years of education: Not on file  . Highest education level: Bachelor's degree (e.g., BA, AB, BS)  Occupational History    Employer: [employer]  Tobacco Use  . Smoking status: Never  . Smokeless tobacco: Never  Vaping Use  . Vaping status: Never Used  Substance and Sexual Activity  . Alcohol use: Not Currently  . Drug use: Never  . Sexual activity: Not Currently    Partners: Male    Birth control/protection: Abstinence, None  Other Topics Concern  . Not on file  Social History Narrative   Lives with her parents and her dog   Works at [employer] in [place] [place]   Social Determinants of Health   Financial Resource Strain: Low Risk  (07/16/2023)   Overall Financial Resource Strain (CARDIA)   . Difficulty of Paying Living Expenses: Not hard at all  Food Insecurity: No Food Insecurity (07/16/2023)   Hunger Vital Sign   . Worried About Programme researcher, broadcasting/film/video in the Last Year: Never true   . Ran Out of Food in the Last Year: Never true  Transportation Needs: No Transportation Needs (07/16/2023)   PRAPARE - Transportation   . Lack of Transportation (Medical): No   . Lack of Transportation (Non-Medical): No  Physical Activity: Insufficiently Active (07/16/2023)   Exercise Vital Sign   . Days of Exercise per Week: 4 days   . Minutes of Exercise per Session: 30 min  Stress: No Stress Concern Present (07/16/2023)    [employer] of Occupational Health - Occupational Stress Questionnaire   . Feeling of Stress : Not at all  Social Connections: Socially Isolated (07/16/2023)   Social Connection and Isolation Panel [NHANES]   . Frequency of Communication with Friends and Family: More than three times a week   . Frequency of Social Gatherings with Friends and Family: More than three times a week   . Attends Religious Services: Never   . Active Member of Clubs or Organizations: No   . Attends Banker Meetings: Never   . Marital Status: Never married    Family History        Family History  Problem Relation Age of Onset  . Kidney Stones Mother   . Glaucoma Maternal Grandmother   . Pneumonia Maternal Grandfather        Passed from pneumonia  . Schizophrenia Maternal Grandfather   . Parkinson's disease Paternal Grandfather     Patient Active Problem List   Diagnosis Date Noted  . Allergic rhinitis 06/03/2021  . Hyperlipidemia 06/03/2021  . 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 Palpitations 01/27/2019  . Cold intolerance 01/27/2019    Past Surgical History:  Procedure Laterality Date  . NO PAST SURGERIES       Current Outpatient Medications:  .  cetirizine (ZYRTEC) 10 MG tablet, Take 10 mg by mouth daily., Disp: , Rfl:  .  fluticasone (FLONASE) 50 MCG/ACT nasal spray, Place 2 sprays into both nostrils daily., Disp: 16 g, Rfl: 6  No Known Allergies  Patient Care Team: Danelle Berry, PA-C as PCP - General (Family Medicine)   Chart Review: ***  Review of Systems        Objective:   Vitals:  Vitals:  07/16/23 0840  BP: 112/76  Pulse: 89  Resp: 16  Temp: 98.1 F (36.7 C)  TempSrc: Oral  SpO2: 99%  Weight: 155 lb 11.2 oz (70.6 kg)  Height: 5\' 8"  (1.727 m)    Body mass index is 23.67 kg/m.  Physical Exam    Fall Risk:    07/16/2023    8:37 AM 06/22/2022    3:02 PM 06/03/2021   10:07 AM 06/01/2020    1:14 PM 02/25/2019   10:19 AM  Fall Risk   Falls in the  past year? 0 0 0 0 0  Number falls in past yr: 0 0 0 0 0  Injury with Fall? 0 0 0 0 0  Risk for fall due to : No Fall Risks No Fall Risks     Follow up Falls prevention discussed;Education provided;Falls evaluation completed Falls prevention discussed;Education provided  Falls evaluation completed     Functional Status Survey: Is the patient deaf or have difficulty hearing?: No Does the patient have difficulty seeing, even when wearing glasses/contacts?: No Does the patient have difficulty concentrating, remembering, or making decisions?: No Does the patient have difficulty walking or climbing stairs?: No Does the patient have difficulty dressing or bathing?: No Does the patient have difficulty doing errands alone such as visiting a doctor's office or shopping?: No   Assessment & Plan:    CPE completed today  USPSTF grade A and B recommendations reviewed with patient; age-appropriate recommendations, preventive care, screening tests, etc discussed and encouraged; healthy living encouraged; see AVS for patient education given to patient  Discussed importance of 150 minutes of physical activity weekly, AHA exercise recommendations given to pt in AVS/handout  Discussed importance of healthy diet:  eating lean meats and proteins, avoiding trans fats and saturated fats, avoid simple sugars and excessive carbs in diet, eat 6 servings of fruit/vegetables daily and drink plenty of water and avoid sweet beverages.    Recommended pt to do annual eye exam and routine dental exams/cleanings  Depression, alcohol, fall screening completed as documented above and per flowsheets  Advance Care planning information and packet discussed and offered today, encouraged pt to discuss with family members/spouse/partner/friends and complete Advanced directive packet and bring copy to office   Reviewed Health Maintenance: Health Maintenance  Topic Date Due  . COVID-19 Vaccine (5 - 2023-24 season) 08/01/2023  (Originally 11/28/2022)  . INFLUENZA VACCINE  07/19/2023  . PAP SMEAR-Modifier  06/23/2027  . DTaP/Tdap/Td (3 - Td or Tdap) 01/27/2029  . Hepatitis C Screening  Completed  . HIV Screening  Completed  . HPV VACCINES  Aged Out    Immunizations: Immunization History  Administered Date(s) Administered  . Influenza,inj,Quad PF,6+ Mos 08/27/2019  . Influenza-Unspecified 09/12/2018, 08/25/2020, 10/03/2022  . MMR 10/12/2018  . Meningococcal Conjugate 07/08/2004  . Moderna Sars-Covid-2 Vaccination 10/03/2022  . PFIZER(Purple Top)SARS-COV-2 Vaccination 02/22/2020, 03/17/2020, 11/18/2020  . Tdap 05/16/2002, 01/27/2019   Vaccines:  HPV: up to at age 50 , ask insurance if age between 74-45  Shingrix: 34-64 yo and ask insurance if covered when patient above 69 yo Pneumonia: *** educated and discussed with patient. Flu: *** educated and discussed with patient. COVID:      ICD-10-CM   1. Annual physical exam  Z00.00     2. Hyperlipidemia, unspecified hyperlipidemia type  E78.5           Danelle Berry, PA-C 07/16/23 8:45 AM  [facility]

## 2023-07-25 ENCOUNTER — Encounter: Payer: Self-pay | Admitting: Family Medicine

## 2024-09-03 ENCOUNTER — Ambulatory Visit (INDEPENDENT_AMBULATORY_CARE_PROVIDER_SITE_OTHER): Admitting: Family Medicine

## 2024-09-03 ENCOUNTER — Encounter: Payer: Self-pay | Admitting: Family Medicine

## 2024-09-03 VITALS — BP 128/74 | HR 73 | Resp 16 | Ht 68.0 in | Wt 158.0 lb

## 2024-09-03 DIAGNOSIS — Z0000 Encounter for general adult medical examination without abnormal findings: Secondary | ICD-10-CM

## 2024-09-03 DIAGNOSIS — E040 Nontoxic diffuse goiter: Secondary | ICD-10-CM

## 2024-09-03 DIAGNOSIS — E162 Hypoglycemia, unspecified: Secondary | ICD-10-CM | POA: Diagnosis not present

## 2024-09-03 DIAGNOSIS — G479 Sleep disorder, unspecified: Secondary | ICD-10-CM

## 2024-09-03 DIAGNOSIS — E785 Hyperlipidemia, unspecified: Secondary | ICD-10-CM | POA: Diagnosis not present

## 2024-09-03 NOTE — Progress Notes (Signed)
 Patient: Gabrielle Ali, Female    DOB: [birth year], 38 y.o.   MRN: [id number] Leavy Mole, PA-C Visit Date: 09/03/2024  Today's Provider: Mole Leavy, PA-C   Chief Complaint  Patient presents with   Annual Exam   Subjective:   Annual physical exam:  Gabrielle Ali is a 38 y.o. female who presents today for complete physical exam:  Exercise/Activity:   treadmill in office walking more standing up more, she's been worried about a little weight    Diet/nutrition:  not yet changing diet Sleep:   no trouble falling asleep (goes to bed 11 to 11:30) but starting to wake up middle of night at 3 am can't go back to sleep   Discussed the use of AI scribe software for clinical note transcription with the patient, who gave verbal consent to proceed.  History of Present Illness Gabrielle Ali is a 38 year old female who presents for a routine follow-up and discussion of sleep and menstrual concerns.  Sleep disturbance - Wakes up earlier than usual approximately six days per month - Typical sleep schedule: bedtime at 11:00-11:30 PM, wakes at 7:00 AM - Occasionally takes midday naps on weekends if feeling tired - No use of sleep aids - No significant lifestyle changes made to address sleep issues - Recently acquired a treadmill to increase physical activity  Menstrual irregularities - Menstrual periods have become heavier and shorter in duration - Cycles remain regular - Requires changing regular extra-long pads every two hours for the first two days of menstruation - Experiences menstrual cramps, managed with over-the-counter medications - No missed work due to menstrual symptoms  Thyroid  history - History of thyroiditis and goiter  Psychological and genitourinary symptoms - No anxiety or mood disturbances - No urinary incontinence    SDOH Screenings   Food Insecurity: No Food Insecurity (09/03/2024)  Housing: Low Risk  (09/03/2024)  Transportation Needs: No  Transportation Needs (09/03/2024)  Utilities: Not At Risk (09/03/2024)  Alcohol Screen: Low Risk  (09/03/2024)  Depression (PHQ2-9): Low Risk  (09/03/2024)  Financial Resource Strain: Low Risk  (09/03/2024)  Physical Activity: Insufficiently Active (09/03/2024)  Social Connections: Socially Isolated (09/03/2024)  Stress: No Stress Concern Present (09/03/2024)  Tobacco Use: Low Risk  (09/03/2024)  Health Literacy: Adequate Health Literacy (09/03/2024)    USPSTF grade A and B recommendations - reviewed and addressed today  Depression:  Phq 9 completed today by patient, was reviewed by me with patient in the room PHQ score is neg, pt feels good    09/03/2024    8:35 AM 07/16/2023    8:37 AM 06/22/2022    3:03 PM 06/03/2021   10:07 AM  PHQ 2/9 Scores  PHQ - 2 Score 0 0 0 0  PHQ- 9 Score  0 0 0      09/03/2024    8:35 AM 07/16/2023    8:37 AM 06/22/2022    3:03 PM 06/03/2021   10:07 AM 06/01/2020    1:15 PM  Depression screen PHQ 2/9  Decreased Interest 0 0 0 0 0  Down, Depressed, Hopeless 0 0 0 0 0  PHQ - 2 Score 0 0 0 0 0  Altered sleeping  0 0 0 0  Tired, decreased energy  0 0 0 0  Change in appetite  0 0 0 0  Feeling bad or failure about yourself   0 0 0 0  Trouble concentrating  0 0 0 0  Moving slowly or fidgety/restless  0  0 0 0  Suicidal thoughts  0 0 0 0  PHQ-9 Score  0 0 0 0  Difficult doing work/chores  Not difficult at all Not difficult at all Not difficult at all Not difficult at all   Alcohol screening: Flowsheet Row Office Visit from 09/03/2024 in [facility]  AUDIT-C Score 0   Immunizations and Health Maintenance: Health Maintenance  Topic Date Due   Influenza Vaccine  07/18/2024   COVID-19 Vaccine (7 - 2025-26 season) 09/18/2024 (Originally 08/18/2024)   Hepatitis B Vaccines 19-59 Average Risk (1 of 3 - 19+ 3-dose series) 09/02/2025 (Originally 07/13/2004)   HPV VACCINES (1 - 3-dose SCDM series) 09/02/2025 (Originally 07/13/2012)   Cervical  Cancer Screening (HPV/Pap Cotest)  06/23/2027   DTaP/Tdap/Td (9 - Td or Tdap) 01/27/2029   Hepatitis C Screening  Completed   HIV Screening  Completed   Pneumococcal Vaccine  Aged Out   Meningococcal B Vaccine  Aged Out    Hep C Screening: done  STD testing and prevention (HIV/chl/gon/syphilis):  see above, no additional testing desired by pt today, none  Intimate partner violence: safe   Sexual History/Pain during Intercourse: Single  Menstrual History/LMP/Abnormal Bleeding: periods are good heavier and shorter, heavy bleeding on 1st d days of cycle changes regular long pads every 2 hours, no other symptoms really bothesome, OTC management  Patient's last menstrual period was 09/03/2024.  Incontinence Symptoms: none   Breast cancer:  Last Mammogram: *see HM list above BRCA gene screening:   Cervical cancer screening: UTD until 2028   Osteoporosis:   Discussion on osteoporosis per age, including high calcium and vitamin D supplementation, weight bearing exercises Reviewed briefly - lengthy discussion not needed due to age and hx  Skin cancer:  Hx of skin CA -no, but she's had lesions biopsied- she sees dermatology [facility] annually    Colorectal cancer:   Colonoscopy is not due for age and hx/fmhx Discussed concerning signs and sx of CRC, pt denies melena, hematochezia, change in BM pattern or caliber   Lung cancer:   Low Dose CT Chest recommended if Age 55-80 years, 20 pack-year currently smoking OR have quit w/in 15years. Patient does not qualify.    Social History   Tobacco Use   Smoking status: Never   Smokeless tobacco: Never  Vaping Use   Vaping status: Never Used  Substance Use Topics   Alcohol use: Not Currently   Drug use: Never     Flowsheet Row Office Visit from 09/03/2024 in [facility]  AUDIT-C Score 0    Family History  Problem Relation Age of Onset   Kidney Stones Mother    Glaucoma Maternal Grandmother     Pneumonia Maternal Grandfather        Passed from pneumonia   Schizophrenia Maternal Grandfather    Parkinson's disease Paternal Grandfather      Blood pressure/Hypertension: BP Readings from Last 3 Encounters:  09/03/24 128/74  07/16/23 112/76  06/22/22 118/74    Weight/Obesity: Wt Readings from Last 3 Encounters:  09/03/24 158 lb (71.7 kg)  07/16/23 155 lb 11.2 oz (70.6 kg)  06/22/22 152 lb 4.8 oz (69.1 kg)   BMI Readings from Last 3 Encounters:  09/03/24 24.02 kg/m  07/16/23 23.67 kg/m  06/22/22 23.16 kg/m     Lipids:  Lab Results  Component Value Date   CHOL 252 (H) 07/16/2023   CHOL 215 (H) 06/22/2022   CHOL 227 (H) 06/06/2021   Lab  Results  Component Value Date   HDL 70 07/16/2023   HDL 71 06/22/2022   HDL 72 06/06/2021   Lab Results  Component Value Date   LDLCALC 156 (H) 07/16/2023   LDLCALC 124 (H) 06/22/2022   LDLCALC 135 (H) 06/06/2021   Lab Results  Component Value Date   TRIG 139 07/16/2023   TRIG 92 06/22/2022   TRIG 94 06/06/2021   Lab Results  Component Value Date   CHOLHDL 3.6 07/16/2023   CHOLHDL 3.0 06/22/2022   CHOLHDL 3.2 06/06/2021   No results found for: LDLDIRECT Based on the results of lipid panel his/her cardiovascular risk factor ( using [facility] )  in the next 10 years is: The ASCVD Risk score (Arnett DK, et al., 2019) failed to calculate for the following reasons:   The 2019 ASCVD risk score is only valid for ages 44 to 100  Glucose:  Glucose, Bld  Date Value Ref Range Status  07/16/2023 86 65 - 99 mg/dL Final    Comment:    .            Fasting reference interval .   06/22/2022 82 65 - 99 mg/dL Final    Comment:    .            Fasting reference interval .   06/06/2021 62 (L) 65 - 99 mg/dL Final    Comment:    .            Fasting reference interval .     Advanced Care Planning:  A voluntary discussion about advance care planning including the explanation and discussion of advance  directives.   Discussed health care proxy and Living will, and the patient was able to identify a health care proxy as mom.   Patient does not have a living will at present time.   Social History       Social History   Socioeconomic History   Marital status: Single    Spouse name: Not on file   Number of children: 0   Years of education: Not on file   Highest education level: Bachelor's degree (e.g., BA, AB, BS)  Occupational History    Employer: [employer]  Tobacco Use   Smoking status: Never   Smokeless tobacco: Never  Vaping Use   Vaping status: Never Used  Substance and Sexual Activity   Alcohol use: Not Currently   Drug use: Never   Sexual activity: Not Currently    Partners: Male    Birth control/protection: Abstinence, None  Other Topics Concern   Not on file  Social History Narrative   Lives with her parents and her dog Rob)   Works at [employer] in [place]   Social Drivers of [employer]: Low Risk  (09/03/2024)   Overall Financial Resource Strain (CARDIA)    Difficulty of Paying Living Expenses: Not hard at all  Food Insecurity: No Food Insecurity (09/03/2024)   Hunger Vital Sign    Worried About Running Out of Food in the Last Year: Never true    Ran Out of Food in the Last Year: Never true  Transportation Needs: No Transportation Needs (09/03/2024)   PRAPARE - Administrator, Civil Service (Medical): No    Lack of Transportation (Non-Medical): No  Physical Activity: Insufficiently Active (09/03/2024)   Exercise Vital Sign    Days of Exercise per Week: 4 days    Minutes of Exercise per Session:  30 min  Stress: No Stress Concern Present (09/03/2024)   [employer] of Occupational Health - Occupational Stress Questionnaire    Feeling of Stress: Not at all  Social Connections: Socially Isolated (09/03/2024)   Social Connection and Isolation Panel    Frequency of Communication with Friends and  Family: More than three times a week    Frequency of Social Gatherings with Friends and Family: More than three times a week    Attends Religious Services: Never    Database administrator or Organizations: No    Attends Engineer, structural: Never    Marital Status: Never married    Family History        Family History  Problem Relation Age of Onset   Kidney Stones Mother    Glaucoma Maternal Grandmother    Pneumonia Maternal Grandfather        Passed from pneumonia   Schizophrenia Maternal Grandfather    Parkinson's disease Paternal Grandfather     Patient Active Problem List   Diagnosis Date Noted   Allergic rhinitis 06/03/2021   Hyperlipidemia 06/03/2021   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   Review of Systems  Constitutional: Negative.   HENT: Negative.    Eyes: Negative.   Respiratory: Negative.    Cardiovascular: Negative.   Gastrointestinal: Negative.   Endocrine: Negative.   Genitourinary: Negative.   Musculoskeletal: Negative.   Skin: Negative.   Allergic/Immunologic: Negative.   Neurological: Negative.   Hematological: Negative.   Psychiatric/Behavioral: Negative.    All other systems reviewed and are negative.         Objective:   Vitals:  Vitals:   09/03/24 0845  BP: 128/74  Pulse: 73  Resp: 16  SpO2: 96%  Weight: 158 lb (71.7 kg)  Height: 5' 8 (1.727 m)    Body mass index  is 24.02 kg/m.  Physical Exam Vitals and nursing note reviewed.  Constitutional:      General: She is not in acute distress.    Appearance: Normal appearance. She is well-developed and well-groomed. She is not ill-appearing, toxic-appearing or diaphoretic.  HENT:     Head: Normocephalic and atraumatic.     Right Ear: External ear normal.     Left Ear: External ear normal.     Nose: Nose normal. No congestion or rhinorrhea.     Mouth/Throat:     Mouth: Mucous membranes are moist.     Pharynx: Oropharynx is clear. Uvula midline. No oropharyngeal exudate or posterior oropharyngeal erythema.  Eyes:     General: Lids are normal. No scleral icterus.       Right eye: No discharge.        Left eye: No discharge.     Conjunctiva/sclera: Conjunctivae normal.     Pupils: Pupils are equal, round, and reactive to light.  Neck:     Thyroid : Thyromegaly present. No thyroid  mass or thyroid  tenderness.  Trachea: Trachea and phonation normal. No tracheal deviation.  Cardiovascular:     Rate and Rhythm: Normal rate and regular rhythm.     Pulses: Normal pulses.          Radial pulses are 2+ on the right side and 2+ on the left side.       Posterior tibial pulses are 2+ on the right side and 2+ on the left side.     Heart sounds: Normal heart sounds. No murmur heard.    No friction rub. No gallop.  Pulmonary:     Effort: Pulmonary effort is normal. No respiratory distress.     Breath sounds: Normal breath sounds. No stridor. No wheezing, rhonchi or rales.  Chest:     Chest wall: No tenderness.  Abdominal:     General: Bowel sounds are normal. There is no distension.     Palpations: Abdomen is soft.  Musculoskeletal:        General: No deformity.     Cervical back: Normal range of motion and neck supple. Normal range of motion.     Right lower leg: No edema.     Left lower leg: No edema.  Lymphadenopathy:     Cervical: No cervical adenopathy.  Skin:    General: Skin is warm and dry.      Capillary Refill: Capillary refill takes less than 2 seconds.     Coloration: Skin is not pale.     Findings: No rash.  Neurological:     Mental Status: She is alert. Mental status is at baseline.     Motor: No abnormal muscle tone.     Coordination: Coordination normal.     Gait: Gait normal.  Psychiatric:        Attention and Perception: Attention normal.        Mood and Affect: Mood and affect normal.        Speech: Speech normal.        Behavior: Behavior normal. Behavior is cooperative.        Thought Content: Thought content normal.       Fall Risk:    09/03/2024    8:35 AM 07/16/2023    8:37 AM 06/22/2022    3:02 PM 06/03/2021   10:07 AM 06/01/2020    1:14 PM  Fall Risk   Falls in the past year? 0 0 0 0 0  Number falls in past yr: 0 0 0 0 0  Injury with Fall? 0 0 0 0 0  Risk for fall due to : No Fall Risks No Fall Risks No Fall Risks    Follow up Falls prevention discussed Falls prevention discussed;Education provided;Falls evaluation completed Falls prevention discussed;Education provided   Falls evaluation completed      Data saved with a previous flowsheet row definition    Functional Status Survey: Is the patient deaf or have difficulty hearing?: No Does the patient have difficulty seeing, even when wearing glasses/contacts?: No Does the patient have difficulty concentrating, remembering, or making decisions?: No Does the patient have difficulty walking or climbing stairs?: No Does the patient have difficulty dressing or bathing?: No Does the patient have difficulty doing errands alone such as visiting a doctor's office or shopping?: No   Assessment & Plan:    CPE completed today  USPSTF grade A and B recommendations reviewed with patient; age-appropriate recommendations, preventive care, screening tests, etc discussed and encouraged; healthy living encouraged; see AVS for patient education given to patient  Discussed  importance of 150 minutes of physical  activity weekly, AHA exercise recommendations given to pt in AVS/handout  Discussed importance of healthy diet:  eating lean meats and proteins, avoiding trans fats and saturated fats, avoid simple sugars and excessive carbs in diet, eat 6 servings of fruit/vegetables daily and drink plenty of water and avoid sweet beverages.    Recommended pt to do annual eye exam and routine dental exams/cleanings  Depression, alcohol, fall screening completed as documented above and per flowsheets  Advance Care planning information and packet discussed and offered today, encouraged pt to discuss with family members/spouse/partner/friends and complete Advanced directive packet and bring copy to office   Reviewed Health Maintenance: Health Maintenance  Topic Date Due   Influenza Vaccine  07/18/2024   COVID-19 Vaccine (7 - 2025-26 season) 09/18/2024 (Originally 08/18/2024)   Hepatitis B Vaccines 19-59 Average Risk (1 of 3 - 19+ 3-dose series) 09/02/2025 (Originally 07/13/2004)   HPV VACCINES (1 - 3-dose SCDM series) 09/02/2025 (Originally 07/13/2012)   Cervical Cancer Screening (HPV/Pap Cotest)  06/23/2027   DTaP/Tdap/Td (9 - Td or Tdap) 01/27/2029   Hepatitis C Screening  Completed   HIV Screening  Completed   Pneumococcal Vaccine  Aged Out   Meningococcal B Vaccine  Aged Out    Immunizations: Immunization History  Administered Date(s) Administered   DTP 10/05/1985, 11/03/1985, 12/01/1985, 10/27/1986, 07/18/1990   Hep B, Unspecified 06/02/1991, 07/07/1991   Influenza,inj,Quad PF,6+ Mos 08/27/2019   Influenza-Unspecified 09/12/2018, 08/25/2020, 10/03/2022   MMR 10/27/1986, 07/18/1990, 10/12/2018, 10/23/2018   Meningococcal Conjugate 07/08/2004   Meningococcal polysaccharide vaccine (MPSV4) 07/08/2004   Moderna Sars-Covid-2 Vaccination 10/03/2022, 09/18/2023   PFIZER Comirnaty(Gray Top)Covid-19 Tri-Sucrose Vaccine 09/02/2021   PFIZER(Purple Top)SARS-COV-2 Vaccination 02/22/2020, 03/17/2020, 11/18/2020    Polio, Unspecified 11/03/1985, 10/03/1986, 10/27/1986, 07/18/1990   Td 05/16/2002   Tdap 05/16/2002, 01/27/2019   Typhoid Live 04/29/1991, 06/02/1991   Vaccines:  Pneumonia:not due  educated and discussed with patient. Flu: due, she gets at work- educated and discussed with patient. COVID vaccine - she wants to get if able, reviewed new guidelines/restrictions with her    ICD-10-CM   1. Well adult exam  Z00.00 Lipid Profile    CBC with Differential/Platelet    Comprehensive Metabolic Panel (CMET)    2. Hyperlipidemia, unspecified hyperlipidemia type  E78.5 Lipid Profile   managing with diet/lifestyle    3. Goiter diffuse  E04.0 Thyroid  Panel With TSH   no change to size, no tenderness, reviewed past imaging and labs    4. Hypoglycemia  E16.2 Comprehensive Metabolic Panel (CMET)   no recent episodes, last 2 years of labs glucose has been normal    5. Sleep disturbances  G47.9    maybe 6 x a month early waking at 3 am unable to fall back asleep, not concerning her right now, discussed sleep hygeine and possible meds          Michelene Cower, PA-C 09/03/24 11:55 AM  [facility]

## 2024-09-03 NOTE — Patient Instructions (Signed)
 Health Maintenance  Topic Date Due   Flu Shot  07/18/2024   COVID-19 Vaccine (7 - 2025-26 season) 09/18/2024*   Hepatitis B Vaccine (1 of 3 - 19+ 3-dose series) 09/02/2025*   HPV Vaccine (1 - 3-dose SCDM series) 09/02/2025*   Pap with HPV screening  06/23/2027   DTaP/Tdap/Td vaccine (9 - Td or Tdap) 01/27/2029   Hepatitis C Screening  Completed   HIV Screening  Completed   Pneumococcal Vaccine  Aged Out   Meningitis B Vaccine  Aged Out  *Topic was postponed. The date shown is not the original due date.   See this link for instructions and documents that will help you complete advance care planning/advanced directives - including designating a medical power of attorney, completing a living will, etc [email or web address]

## 2024-09-04 ENCOUNTER — Ambulatory Visit: Payer: Self-pay | Admitting: Family Medicine

## 2024-09-04 LAB — CBC WITH DIFFERENTIAL/PLATELET
Absolute Lymphocytes: 1324 {cells}/uL (ref 850–3900)
Absolute Monocytes: 525 {cells}/uL (ref 200–950)
Basophils Absolute: 49 {cells}/uL (ref 0–200)
Basophils Relative: 0.8 %
Eosinophils Absolute: 98 {cells}/uL (ref 15–500)
Eosinophils Relative: 1.6 %
HCT: 37.9 % (ref 35.0–45.0)
Hemoglobin: 12 g/dL (ref 11.7–15.5)
MCH: 28.8 pg (ref 27.0–33.0)
MCHC: 31.7 g/dL — ABNORMAL LOW (ref 32.0–36.0)
MCV: 91.1 fL (ref 80.0–100.0)
MPV: 10.1 fL (ref 7.5–12.5)
Monocytes Relative: 8.6 %
Neutro Abs: 4105 {cells}/uL (ref 1500–7800)
Neutrophils Relative %: 67.3 %
Platelets: 413 Thousand/uL — ABNORMAL HIGH (ref 140–400)
RBC: 4.16 Million/uL (ref 3.80–5.10)
RDW: 12.9 % (ref 11.0–15.0)
Total Lymphocyte: 21.7 %
WBC: 6.1 Thousand/uL (ref 3.8–10.8)

## 2024-09-04 LAB — COMPREHENSIVE METABOLIC PANEL WITH GFR
AG Ratio: 1.5 (calc) (ref 1.0–2.5)
ALT: 11 U/L (ref 6–29)
AST: 13 U/L (ref 10–30)
Albumin: 4.6 g/dL (ref 3.6–5.1)
Alkaline phosphatase (APISO): 21 U/L — ABNORMAL LOW (ref 31–125)
BUN: 8 mg/dL (ref 7–25)
CO2: 29 mmol/L (ref 20–32)
Calcium: 9.3 mg/dL (ref 8.6–10.2)
Chloride: 104 mmol/L (ref 98–110)
Creat: 0.63 mg/dL (ref 0.50–0.97)
Globulin: 3 g/dL (ref 1.9–3.7)
Glucose, Bld: 81 mg/dL (ref 65–99)
Potassium: 5.1 mmol/L (ref 3.5–5.3)
Sodium: 139 mmol/L (ref 135–146)
Total Bilirubin: 0.3 mg/dL (ref 0.2–1.2)
Total Protein: 7.6 g/dL (ref 6.1–8.1)
eGFR: 116 mL/min/1.73m2 (ref >=60)

## 2024-09-04 LAB — LIPID PANEL
Cholesterol: 207 mg/dL — ABNORMAL HIGH (ref <200)
HDL: 68 mg/dL (ref >=50)
LDL Cholesterol (Calc): 117 mg/dL — ABNORMAL HIGH
Non-HDL Cholesterol (Calc): 139 mg/dL — ABNORMAL HIGH (ref <130)
Total CHOL/HDL Ratio: 3 (calc) (ref <5.0)
Triglycerides: 113 mg/dL (ref <150)

## 2024-09-04 LAB — THYROID PANEL WITH TSH
Free Thyroxine Index: 2 (ref 1.4–3.8)
T3 Uptake: 26 % (ref 22–35)
T4, Total: 7.5 ug/dL (ref 5.1–11.9)
TSH: 4.03 m[IU]/L

## 2025-09-04 ENCOUNTER — Encounter: Admitting: Family Medicine
